# Patient Record
Sex: Male | Born: 1985 | Race: White | Hispanic: No | Marital: Married | Smoking: Never smoker
Health system: Southern US, Community
[De-identification: ages and names within clinical notes are randomized; demographics above are authoritative.]

## PROBLEM LIST (undated history)

## (undated) DIAGNOSIS — J45909 Unspecified asthma, uncomplicated: Secondary | ICD-10-CM

## (undated) DIAGNOSIS — N200 Calculus of kidney: Secondary | ICD-10-CM

## (undated) HISTORY — PX: URETEROSCOPY: SHX842

---

## 2014-01-26 ENCOUNTER — Encounter (HOSPITAL_COMMUNITY): Payer: Self-pay | Admitting: Emergency Medicine

## 2014-01-26 ENCOUNTER — Emergency Department (HOSPITAL_COMMUNITY): Payer: No Typology Code available for payment source

## 2014-01-26 ENCOUNTER — Emergency Department (HOSPITAL_COMMUNITY)
Admission: EM | Admit: 2014-01-26 | Discharge: 2014-01-26 | Disposition: A | Discharge status: Home / Self Care | Payer: No Typology Code available for payment source | Attending: Emergency Medicine | Admitting: Emergency Medicine

## 2014-01-26 DIAGNOSIS — Z87442 Personal history of urinary calculi: Secondary | ICD-10-CM | POA: Diagnosis not present

## 2014-01-26 DIAGNOSIS — Z720 Tobacco use: Secondary | ICD-10-CM | POA: Insufficient documentation

## 2014-01-26 DIAGNOSIS — J45909 Unspecified asthma, uncomplicated: Secondary | ICD-10-CM | POA: Diagnosis not present

## 2014-01-26 DIAGNOSIS — R197 Diarrhea, unspecified: Secondary | ICD-10-CM | POA: Insufficient documentation

## 2014-01-26 DIAGNOSIS — J159 Unspecified bacterial pneumonia: Secondary | ICD-10-CM | POA: Insufficient documentation

## 2014-01-26 DIAGNOSIS — M549 Dorsalgia, unspecified: Secondary | ICD-10-CM | POA: Diagnosis not present

## 2014-01-26 DIAGNOSIS — N3090 Cystitis, unspecified without hematuria: Secondary | ICD-10-CM

## 2014-01-26 DIAGNOSIS — R319 Hematuria, unspecified: Secondary | ICD-10-CM | POA: Diagnosis present

## 2014-01-26 DIAGNOSIS — R310 Gross hematuria: Secondary | ICD-10-CM

## 2014-01-26 DIAGNOSIS — N3091 Cystitis, unspecified with hematuria: Secondary | ICD-10-CM | POA: Insufficient documentation

## 2014-01-26 DIAGNOSIS — R109 Unspecified abdominal pain: Secondary | ICD-10-CM

## 2014-01-26 DIAGNOSIS — J189 Pneumonia, unspecified organism: Secondary | ICD-10-CM

## 2014-01-26 HISTORY — DX: Calculus of kidney: N20.0

## 2014-01-26 HISTORY — DX: Unspecified asthma, uncomplicated: J45.909

## 2014-01-26 LAB — URINE MICROSCOPIC-ADD ON

## 2014-01-26 LAB — COMPREHENSIVE METABOLIC PANEL
ALBUMIN: 4.2 g/dL (ref 3.5–5.2)
ALT: 19 U/L (ref 0–53)
ANION GAP: 6 (ref 5–15)
AST: 24 U/L (ref 0–37)
Alkaline Phosphatase: 56 U/L (ref 39–117)
BILIRUBIN TOTAL: 1.1 mg/dL (ref 0.3–1.2)
BUN: 9 mg/dL (ref 6–23)
CALCIUM: 9 mg/dL (ref 8.4–10.5)
CO2: 26 mmol/L (ref 19–32)
CREATININE: 0.93 mg/dL (ref 0.50–1.35)
Chloride: 106 mEq/L (ref 96–112)
GFR calc non Af Amer: >90 mL/min (ref >90)
Glucose, Bld: 87 mg/dL (ref 70–99)
POTASSIUM: 4 mmol/L (ref 3.5–5.1)
Sodium: 138 mmol/L (ref 135–145)
Total Protein: 6.9 g/dL (ref 6.0–8.3)

## 2014-01-26 LAB — URINALYSIS, ROUTINE W REFLEX MICROSCOPIC
BILIRUBIN URINE: NEGATIVE
Glucose, UA: NEGATIVE mg/dL
Ketones, ur: NEGATIVE mg/dL
NITRITE: NEGATIVE
PH: 6 (ref 5.0–8.0)
Protein, ur: 30 mg/dL — AB
SPECIFIC GRAVITY, URINE: 1.018 (ref 1.005–1.030)
Urobilinogen, UA: 4 mg/dL — ABNORMAL HIGH (ref 0.0–1.0)

## 2014-01-26 LAB — CBC WITH DIFFERENTIAL/PLATELET
BASOS ABS: 0.1 10*3/uL (ref 0.0–0.1)
BASOS PCT: 1 % (ref 0–1)
Eosinophils Absolute: 0 10*3/uL (ref 0.0–0.7)
Eosinophils Relative: 0 % (ref 0–5)
HEMATOCRIT: 43.3 % (ref 39.0–52.0)
Hemoglobin: 15.6 g/dL (ref 13.0–17.0)
LYMPHS PCT: 17 % (ref 12–46)
Lymphs Abs: 0.9 10*3/uL (ref 0.7–4.0)
MCH: 29.4 pg (ref 26.0–34.0)
MCHC: 36 g/dL (ref 30.0–36.0)
MCV: 81.7 fL (ref 78.0–100.0)
MONO ABS: 0.7 10*3/uL (ref 0.1–1.0)
Monocytes Relative: 12 % (ref 3–12)
NEUTROS ABS: 3.7 10*3/uL (ref 1.7–7.7)
NEUTROS PCT: 70 % (ref 43–77)
PLATELETS: 191 10*3/uL (ref 150–400)
RBC: 5.3 MIL/uL (ref 4.22–5.81)
RDW: 12 % (ref 11.5–15.5)
WBC: 5.4 10*3/uL (ref 4.0–10.5)

## 2014-01-26 MED ORDER — AZITHROMYCIN 250 MG PO TABS
500.0000 mg | ORAL_TABLET | Freq: Once | ORAL | Status: AC
  Administered 2014-01-26: 500 mg via ORAL
  Filled 2014-01-26: qty 2

## 2014-01-26 MED ORDER — SULFAMETHOXAZOLE-TRIMETHOPRIM 800-160 MG PO TABS: 1.0000 | ORAL_TABLET | Freq: Two times a day (BID) | ORAL | Status: AC

## 2014-01-26 MED ORDER — AZITHROMYCIN 250 MG PO TABS: 250.0000 mg | ORAL_TABLET | Freq: Every day | ORAL | Status: DC

## 2014-01-26 MED ORDER — SULFAMETHOXAZOLE-TRIMETHOPRIM 800-160 MG PO TABS
1.0000 | ORAL_TABLET | Freq: Once | ORAL | Status: AC
  Administered 2014-01-26: 1 via ORAL
  Filled 2014-01-26: qty 1

## 2014-01-26 NOTE — ED Notes (Signed)
Pt reports intermittent right flank pain since yesterday, states he has multiple kidney stones that he has not passed, denies pain at this time but has noticed blood in his urine this afternoon, no distress noted.

## 2014-01-26 NOTE — ED Provider Notes (Signed)
CSN: [id number]     Arrival date & time 01/26/14  [id number] History  This chart was scribed for non-physician practitioner working with Tilden FossaElizabeth Rees, MD by Elveria Risingimelie Horne, ED Scribe. This patient was seen in room TR07C/TR07C and the patient's care was started at 8:57 PM.   Chief Complaint  Patient presents with  . Hematuria    The history is provided by the patient. No language interpreter was used.   HPI Comments: Steven Walls is a 28 y.o. male with PMHx of kidney stones who presents to the Emergency Department complaining of hematuria and right flank pain, onset yesterday. Patient reports left flank pain, then right flank pain. This morning patient's pain radiated to right lower quadrant. Patient reports that the blood in urine has darkened since onset of his pain. Patient reports intermittent fever at home and diarrhea this morning. Patient shares that he has 9 kidney stones and has passed three. Patient reports that since 2005 he has experienced severe, sharp pain associated with kidney stones twice, but this pain is different. Patient states that his current is less severe nature. Patient is here from [place] and states that he will be driving back tomorrow. Patient has a urologist there with whom he can follow up.   Past Medical History  Diagnosis Date  . Renal stones   . Asthma    History reviewed. No pertinent past surgical history. No family history on file. History  Substance Use Topics  . Smoking status: Current Every Day Smoker  . Smokeless tobacco: Not on file  . Alcohol Use: No    Review of Systems  Constitutional: Positive for fever. Negative for chills.  Gastrointestinal: Positive for diarrhea.  Genitourinary: Positive for hematuria and flank pain. Negative for dysuria.  Musculoskeletal: Positive for back pain.  All other systems reviewed and are negative.  Allergies  Review of patient's allergies indicates no known allergies.  Home Medications   Prior to  Admission medications   Not on File   Triage Vitals: BP 116/69 mmHg  Pulse 86  Temp(Src) 97.5 F (36.4 C) (Oral)  Resp 16  Ht 5\' 5"  (1.651 m)  Wt 130 lb (58.968 kg)  BMI 21.63 kg/m2  SpO2 100% Physical Exam  Constitutional: He is oriented to person, place, and time. He appears well-developed and well-nourished. No distress.  HENT:  Head: Normocephalic and atraumatic.  Eyes: EOM are normal.  Neck: Neck supple. No tracheal deviation present.  Cardiovascular: Normal rate.   Pulmonary/Chest: Effort normal. No respiratory distress.  Abdominal:  Right lower flank pain radiating to RLQ.   Musculoskeletal: Normal range of motion.  Neurological: He is alert and oriented to person, place, and time.  Skin: Skin is warm and dry.  Psychiatric: He has a normal mood and affect. His behavior is normal.  Nursing note and vitals reviewed.   ED Course  Procedures (including critical care time)  COORDINATION OF CARE: 8:57 PM- Discussed treatment plan with patient at bedside and patient agreed to plan.   Labs Review Labs Reviewed  URINALYSIS, ROUTINE W REFLEX MICROSCOPIC - Abnormal; Notable for the following:    Color, Urine AMBER (*)    APPearance CLOUDY (*)    Hgb urine dipstick LARGE (*)    Protein, ur 30 (*)    Urobilinogen, UA 4.0 (*)    Leukocytes, UA TRACE (*)    All other components within normal limits  URINE MICROSCOPIC-ADD ON - Abnormal; Notable for the following:    Bacteria, UA FEW (*)  All other components within normal limits  CBC WITH DIFFERENTIAL  COMPREHENSIVE METABOLIC PANEL    Imaging Review No results found.   EKG Interpretation None     Radiology and labs reviewed, results shared with patient. CT reveals non-obstructive kidney stones bilaterally, likely cystitis, and left lower lobe pneumonia.  Patient is visiting from [place] and will be return home tomorrow. He has a PCP and nephrologist to follow-up with.  Given initial dose of azithromycin and septra  in ED, with prescriptions provided for continuation of treatment. MDM   Final diagnoses:  None    LLL pneumonia. Cystitis.  I personally performed the services described in this documentation, which was scribed in my presence. The recorded information has been reviewed and is accurate.    Jimmye Normanavid John Sheryl Saintil, NP 01/26/14 [id number]  Tilden FossaElizabeth Rees, MD 01/26/14 [phone]

## 2014-01-26 NOTE — Discharge Instructions (Signed)
Urinary Tract Infection °Urinary tract infections (UTIs) can develop anywhere along your urinary tract. Your urinary tract is your body's drainage system for removing wastes and extra water. Your urinary tract includes two kidneys, two ureters, a bladder, and a urethra. Your kidneys are a pair of bean-shaped organs. Each kidney is about the size of your fist. They are located below your ribs, one on each side of your spine. °CAUSES °Infections are caused by microbes, which are microscopic organisms, including fungi, viruses, and bacteria. These organisms are so small that they can only be seen through a microscope. Bacteria are the microbes that most commonly cause UTIs. °SYMPTOMS  °Symptoms of UTIs may vary by age and gender of the patient and by the location of the infection. Symptoms in young women typically include a frequent and intense urge to urinate and a painful, burning feeling in the bladder or urethra during urination. Older women and men are more likely to be tired, shaky, and weak and have muscle aches and abdominal pain. A fever may mean the infection is in your kidneys. Other symptoms of a kidney infection include pain in your back or sides below the ribs, nausea, and vomiting. °DIAGNOSIS °To diagnose a UTI, your caregiver will ask you about your symptoms. Your caregiver also will ask to provide a urine sample. The urine sample will be tested for bacteria and white blood cells. White blood cells are made by your body to help fight infection. °TREATMENT  °Typically, UTIs can be treated with medication. Because most UTIs are caused by a bacterial infection, they usually can be treated with the use of antibiotics. The choice of antibiotic and length of treatment depend on your symptoms and the type of bacteria causing your infection. °HOME CARE INSTRUCTIONS °· If you were prescribed antibiotics, take them exactly as your caregiver instructs you. Finish the medication even if you feel better after you  have only taken some of the medication. °· Drink enough water and fluids to keep your urine clear or pale yellow. °· Avoid caffeine, tea, and carbonated beverages. They tend to irritate your bladder. °· Empty your bladder often. Avoid holding urine for long periods of time. °· Empty your bladder before and after sexual intercourse. °· After a bowel movement, women should cleanse from front to back. Use each tissue only once. °SEEK MEDICAL CARE IF:  °· You have back pain. °· You develop a fever. °· Your symptoms do not begin to resolve within 3 days. °SEEK IMMEDIATE MEDICAL CARE IF:  °· You have severe back pain or lower abdominal pain. °· You develop chills. °· You have nausea or vomiting. °· You have continued burning or discomfort with urination. °MAKE SURE YOU:  °· Understand these instructions. °· Will watch your condition. °· Will get help right away if you are not doing well or get worse. °Document Released: 10/23/2004 Document Revised: 07/15/2011 Document Reviewed: 02/21/2011 °ExitCare® Patient Information ©2015 [employer]. This information is not intended to replace advice given to you by your health care provider. Make sure you discuss any questions you have with your health care provider. ° °Pneumonia °Pneumonia is an infection of the lungs.  °CAUSES °Pneumonia may be caused by bacteria or a virus. Usually, these infections are caused by breathing infectious particles into the lungs (respiratory tract). °SIGNS AND SYMPTOMS  °· Cough. °· Fever. °· Chest pain. °· Increased rate of breathing. °· Wheezing. °· Mucus production. °DIAGNOSIS  °If you have the common symptoms of pneumonia, your health   care provider will typically confirm the diagnosis with a chest X-ray. The X-ray will show an abnormality in the lung (pulmonary infiltrate) if you have pneumonia. Other tests of your blood, urine, or sputum may be done to find the specific cause of your pneumonia. Your health care provider may also do tests (blood  gases or pulse oximetry) to see how well your lungs are working. °TREATMENT  °Some forms of pneumonia may be spread to other people when you cough or sneeze. You may be asked to wear a mask before and during your exam. Pneumonia that is caused by bacteria is treated with antibiotic medicine. Pneumonia that is caused by the influenza virus may be treated with an antiviral medicine. Most other viral infections must run their course. These infections will not respond to antibiotics.  °HOME CARE INSTRUCTIONS  °· Cough suppressants may be used if you are losing too much rest. However, coughing protects you by clearing your lungs. You should avoid using cough suppressants if you can. °· Your health care provider may have prescribed medicine if he or she thinks your pneumonia is caused by bacteria or influenza. Finish your medicine even if you start to feel better. °· Your health care provider may also prescribe an expectorant. This loosens the mucus to be coughed up. °· Take medicines only as directed by your health care provider. °· Do not smoke. Smoking is a common cause of bronchitis and can contribute to pneumonia. If you are a smoker and continue to smoke, your cough may last several weeks after your pneumonia has cleared. °· A cold steam vaporizer or humidifier in your room or home may help loosen mucus. °· Coughing is often worse at night. Sleeping in a semi-upright position in a recliner or using a couple pillows under your head will help with this. °· Get rest as you feel it is needed. Your body will usually let you know when you need to rest. °PREVENTION °A pneumococcal shot (vaccine) is available to prevent a common bacterial cause of pneumonia. This is usually suggested for: °· People over 65 years old. °· Patients on chemotherapy. °· People with chronic lung problems, such as bronchitis or emphysema. °· People with immune system problems. °If you are over 65 or have a high risk condition, you may receive the  pneumococcal vaccine if you have not received it before. In some countries, a routine influenza vaccine is also recommended. This vaccine can help prevent some cases of pneumonia. You may be offered the influenza vaccine as part of your care. °If you smoke, it is time to quit. You may receive instructions on how to stop smoking. Your health care provider can provide medicines and counseling to help you quit. °SEEK MEDICAL CARE IF: °You have a fever. °SEEK IMMEDIATE MEDICAL CARE IF:  °· Your illness becomes worse. This is especially true if you are elderly or weakened from any other disease. °· You cannot control your cough with suppressants and are losing sleep. °· You begin coughing up blood. °· You develop pain which is getting worse or is uncontrolled with medicines. °· Any of the symptoms which initially brought you in for treatment are getting worse rather than better. °· You develop shortness of breath or chest pain. °MAKE SURE YOU:  °· Understand these instructions. °· Will watch your condition. °· Will get help right away if you are not doing well or get worse. °Document Released: 01/13/2005 Document Revised: 05/30/2013 Document Reviewed: 04/04/2010 °ExitCare® Patient Information ©2015 [employer].   This information is not intended to replace advice given to you by your health care provider. Make sure you discuss any questions you have with your health care provider. ° ° °

## 2014-01-26 NOTE — ED Notes (Signed)
Pt. reports hematuria and low back pain onset yesterday , denies fever or dysuria . No fall or injury.

## 2020-03-02 ENCOUNTER — Ambulatory Visit: Payer: No Typology Code available for payment source | Admitting: Family Medicine

## 2020-03-23 ENCOUNTER — Other Ambulatory Visit: Payer: Self-pay | Admitting: Urology

## 2020-03-23 DIAGNOSIS — N201 Calculus of ureter: Secondary | ICD-10-CM

## 2020-03-26 ENCOUNTER — Other Ambulatory Visit (HOSPITAL_COMMUNITY)
Admission: RE | Admit: 2020-03-26 | Discharge: 2020-03-26 | Disposition: A | Discharge status: Home / Self Care | Payer: PRIVATE HEALTH INSURANCE | Source: Ambulatory Visit | Attending: Urology | Admitting: Urology

## 2020-03-26 DIAGNOSIS — Z01812 Encounter for preprocedural laboratory examination: Secondary | ICD-10-CM | POA: Insufficient documentation

## 2020-03-26 DIAGNOSIS — Z20822 Contact with and (suspected) exposure to covid-19: Secondary | ICD-10-CM | POA: Diagnosis not present

## 2020-03-26 LAB — SARS CORONAVIRUS 2 (TAT 6-24 HRS): SARS Coronavirus 2: NEGATIVE

## 2020-03-27 NOTE — Progress Notes (Signed)
Patient to arrive at 0915 on 03/29/20. History and medications reviewed. All pre-procedure instructions given. NPO after MN on Wednesday except for clear liquids until 0715. Driver secured.

## 2020-03-28 NOTE — H&P (Signed)
Patient is a 35 year old middle [place] male who presents for follow-up of kidney stones. He had has history of stones dating back to 2005 when he was living in saw the [place]. No intervention performed at that time. He subsequently moved to the [place] and was living in [place] in 2017 and had acute onset of flank pain. Ultimately required ureteroscopy and laser lithotripsy with placement of a stent at that time. Was told that he had calcium based stones. Has had no interim GU issues but was told he may have some small stones remaining in the kidney. He wanted to establish urologic follow-up in regards to his kidney stones. Currently he is asymptomatic  Micro urinalysis today shows 3-10 RBCs and rare bacteria  KUB is reviewed today and shows: No obvious bony abnormalities. There is a slender call calcification measuring about 8 mm x 4 mm in the region of the right UPJ or renal pelvic area. There is also a smaller 3 mm calcification overlying the left renal shadow. No obvious distal ureteral calculi noted.  -03/23/20-patient with prior history of nephrolithiasis. Was establishing care here and had KUB suggesting possible UPJ stone on the right. Has had CT urogram in the interim on 03/22/2020 which was reviewed today. This shows some small bilateral nonobstructing stones. There is a 9 x5 mm calculus in the mid right ureter the L3-L4 level with some mild hydronephrosis.(report says 21mm) Here to discuss next steps of management. See CT report below. The patient has been relatively asymptomatic with only occasional pain in the right side but does not last for more than 10 seconds at a time by his report. Had no nausea vomiting fever or gross hematuria.    CLINICAL DATA: History of renal calculus, re-evaluate stone burden.  Currently no pain, no fever.   EXAM:  CT ABDOMEN AND PELVIS WITHOUT CONTRAST   TECHNIQUE:  Multidetector CT imaging of the abdomen and pelvis was performed  following the standard  protocol without IV contrast.   COMPARISON: CT abdomen pelvis February 28, 2015 and January 26, 2014.   FINDINGS:  Lower chest: No acute abnormality. Normal size heart. No pericardial  effusion.   Hepatobiliary: Unremarkable noncontrast appearance of the hepatic  parenchyma. Gallbladder is unremarkable. No biliary ductal  dilatation.   Pancreas: Unremarkable   Spleen: Unremarkable   Adrenals/Urinary Tract: Bilateral adrenal glands are unremarkable.   RIGHT sided mild hydroureteronephrosis to the level of a 4 mm stone  in the mid ureter at the L3-L4 vertebral body level. There are 2  other small nonobstructive renal calculi including a 2 mm upper  polar and 3 mm interpolar renal calculus.   LEFT sided 3 mm lower pole and punctate interpolar renal calculi  both of which are new or enlarged since prior. The previously  visualized upper pole renal calculus is no longer present.   Urinary bladder is incompletely distended otherwise unremarkable.   Stomach/Bowel: Stomach is within normal limits. Appendix appears  normal. No evidence of bowel wall thickening, distention, or  inflammatory changes.   Vascular/Lymphatic: No significant vascular findings are present. No  enlarged abdominal or pelvic lymph nodes.   Reproductive: Prostate is unremarkable.   Other: No abdominal wall hernia or abnormality. No abdominopelvic  ascites.   Musculoskeletal: No acute or significant osseous findings.   IMPRESSION:  1. Mild RIGHT-sided hydroureteronephrosis to the level of a 4 mm  stone in the mid ureter at the L3-L4 vertebral body level.  2. Multiple additional small bilateral nonobstructive  renal calculi.   These results will be called to the ordering clinician or  representative by the Radiologist Assistant, and communication  documented in the PACS or [employer].    Electronically Signed  By: Maudry Mayhew MD  On: 03/22/2020 13:15      ALLERGIES: None   MEDICATIONS:  Meloxicam 7.5 mg tablet     GU PSH: Ureteroscopic stone removal     NON-GU PSH: No Non-GU PSH    GU PMH: History of urolithiasis - 03/21/2020, - 02/16/2020    NON-GU PMH: Asthma    FAMILY HISTORY: kidney stone - Brother   SOCIAL HISTORY: Marital Status: Married Ethnicity: Not Hispanic Or Latino Current Smoking Status: Patient does not smoke anymore.   Tobacco Use Assessment Completed: Used Tobacco in last 30 days? Has never drank.  Drinks 1 caffeinated drink per day.    REVIEW OF SYSTEMS:    GU Review Male:   Patient denies frequent urination, hard to postpone urination, burning/ pain with urination, get up at night to urinate, leakage of urine, stream starts and stops, trouble starting your stream, have to strain to urinate , erection problems, and penile pain.  Gastrointestinal (Upper):   Patient denies nausea, vomiting, and indigestion/ heartburn.  Gastrointestinal (Lower):   Patient denies diarrhea and constipation.  Constitutional:   Patient denies fever, night sweats, weight loss, and fatigue.  Skin:   Patient denies skin rash/ lesion and itching.  Eyes:   Patient denies blurred vision and double vision.  Ears/ Nose/ Throat:   Patient denies sore throat and sinus problems.  Hematologic/Lymphatic:   Patient denies swollen glands and easy bruising.  Cardiovascular:   Patient denies leg swelling and chest pains.  Respiratory:   Patient denies cough and shortness of breath.  Endocrine:   Patient denies excessive thirst.  Musculoskeletal:   Patient denies back pain and joint pain.  Neurological:   Patient denies headaches and dizziness.  Psychologic:   Patient denies depression and anxiety.   VITAL SIGNS:      03/23/2020 08:27 AM  Weight 140 lb / 63.5 kg  Height 65 in / 165.1 cm  BP 114/70 mmHg  Pulse 61 /min  Temperature 98.2 F / 36.7 C  BMI 23.3 kg/m   MULTI-SYSTEM PHYSICAL EXAMINATION:    Constitutional: Well-nourished. No physical deformities. Normally  developed. Good grooming.  Neck: Neck symmetrical, not swollen. Normal tracheal position.  Respiratory: No labored breathing, no use of accessory muscles.   Cardiovascular: Normal temperature, normal extremity pulses, no swelling, no varicosities.  Lymphatic: No enlargement of neck, axillae, groin.  Skin: No paleness, no jaundice, no cyanosis. No lesion, no ulcer, no rash.  Neurologic / Psychiatric: Oriented to time, oriented to place, oriented to person. No depression, no anxiety, no agitation.  Eyes: Normal conjunctivae. Normal eyelids.  Ears, Nose, Mouth, and Throat: Left ear no scars, no lesions, no masses. Right ear no scars, no lesions, no masses. Nose no scars, no lesions, no masses. Normal hearing. Normal lips.  Musculoskeletal: Normal gait and station of head and neck.     Complexity of Data:  Source Of History:  Patient  Records Review:   Previous Doctor Records  Urine Test Review:   Urinalysis  X-Ray Review: C.T. Abdomen/Pelvis: Reviewed Films. Reviewed Report. Discussed With Patient.     PROCEDURES:          Urinalysis w/Scope Dipstick Dipstick Cont'd Micro  Color: Yellow Bilirubin: Neg mg/dL WBC/hpf: 0 - 5/hpf  Appearance: Clear Ketones: Neg mg/dL  RBC/hpf: 20 - 40/hpf  Specific Gravity: 1.025 Blood: 1+ ery/uL Bacteria: Rare (0-9/hpf)  pH: 6.0 Protein: Neg mg/dL Cystals: NS (Not Seen)  Glucose: Neg mg/dL Urobilinogen: 0.2 mg/dL Casts: NS (Not Seen)    Nitrites: Neg Trichomonas: Not Present    Leukocyte Esterase: Neg leu/uL Mucous: Present      Epithelial Cells: 0 - 5/hpf      Yeast: NS (Not Seen)      Sperm: Not Present    ASSESSMENT:      ICD-10 Details  1 GU:   Ureteral calculus - N20.1 Acute, Complicated Injury   PLAN:           Document Letter(s):  Created for Patient: Clinical Summary         Notes:   I discussed CT results in in detail showing a 9 x 6 mm right mid ureteral calculus. We discussed treatment options including ureteroscopy with laser  lithotripsy versus in situ ESL. I told him it would be unusual to pass the stone this size therefore recommended treatment. After discussion of options of management he has elected for in situ ESL. This will be scheduled accordingly in the near future. Risks and benefits discussed in detail as below.  I have discussed with the patient the risks and consequences of the procedure of extracorporeal shockwave lithotripsy to include, but not limited to: Bleeding, including bleeding in the urine, bleeding around the kidney with hematoma formation and rarely bleeding to the point of loss of the kidney, infection, damage to the surrounding structures including soft tissue and bowel perforation, residual stone fragments requiring the need for future treatments including endoscopic surgery, open surgery or percutaneous surgery. I have emphasized that study showed that up to 25% of patients will require additional procedures depending on stone size and composition. I have also discussed with the patient that the success rate for ESWL is approximately 60-90% and depends on stone location and stone composition as well as preoperative stone size. The patient voices understanding of the risks and benefits of the above and consents to the procedure.

## 2020-03-29 ENCOUNTER — Ambulatory Visit (HOSPITAL_COMMUNITY): Payer: PRIVATE HEALTH INSURANCE

## 2020-03-29 ENCOUNTER — Other Ambulatory Visit: Payer: Self-pay

## 2020-03-29 ENCOUNTER — Encounter (HOSPITAL_BASED_OUTPATIENT_CLINIC_OR_DEPARTMENT_OTHER)
Admission: RE | Disposition: A | Discharge status: Home / Self Care | Payer: Self-pay | Source: Home / Self Care | Attending: Urology

## 2020-03-29 ENCOUNTER — Telehealth: Payer: Self-pay | Admitting: Urology

## 2020-03-29 ENCOUNTER — Ambulatory Visit (HOSPITAL_BASED_OUTPATIENT_CLINIC_OR_DEPARTMENT_OTHER)
Admission: RE | Admit: 2020-03-29 | Discharge: 2020-03-29 | Disposition: A | Discharge status: Home / Self Care | Payer: PRIVATE HEALTH INSURANCE | Attending: Urology | Admitting: Urology

## 2020-03-29 ENCOUNTER — Encounter (HOSPITAL_BASED_OUTPATIENT_CLINIC_OR_DEPARTMENT_OTHER): Payer: Self-pay | Admitting: Urology

## 2020-03-29 DIAGNOSIS — N201 Calculus of ureter: Secondary | ICD-10-CM | POA: Diagnosis present

## 2020-03-29 HISTORY — PX: EXTRACORPOREAL SHOCK WAVE LITHOTRIPSY: SHX1557

## 2020-03-29 SURGERY — LITHOTRIPSY, ESWL
Anesthesia: LOCAL | Laterality: Right

## 2020-03-29 MED ORDER — CIPROFLOXACIN HCL 500 MG PO TABS
500.0000 mg | ORAL_TABLET | ORAL | Status: AC
  Administered 2020-03-29: 500 mg via ORAL

## 2020-03-29 MED ORDER — OXYCODONE-ACETAMINOPHEN 5-325 MG PO TABS
ORAL_TABLET | ORAL | Status: AC
  Filled 2020-03-29: qty 1

## 2020-03-29 MED ORDER — OXYCODONE-ACETAMINOPHEN 5-325 MG PO TABS
1.0000 | ORAL_TABLET | Freq: Once | ORAL | Status: AC
  Administered 2020-03-29: 1 via ORAL

## 2020-03-29 MED ORDER — DIAZEPAM 5 MG PO TABS
ORAL_TABLET | ORAL | Status: AC
  Filled 2020-03-29: qty 2

## 2020-03-29 MED ORDER — DIAZEPAM 5 MG PO TABS
10.0000 mg | ORAL_TABLET | ORAL | Status: AC
  Administered 2020-03-29: 10 mg via ORAL

## 2020-03-29 MED ORDER — DIPHENHYDRAMINE HCL 25 MG PO CAPS
ORAL_CAPSULE | ORAL | Status: AC
  Filled 2020-03-29: qty 1

## 2020-03-29 MED ORDER — OXYCODONE-ACETAMINOPHEN 5-325 MG PO TABS: 1.0000 | ORAL_TABLET | ORAL | 0 refills | Status: AC | PRN

## 2020-03-29 MED ORDER — TAMSULOSIN HCL 0.4 MG PO CAPS: 0.4000 mg | ORAL_CAPSULE | Freq: Every day | ORAL | 0 refills | Status: DC

## 2020-03-29 MED ORDER — DIPHENHYDRAMINE HCL 25 MG PO CAPS
25.0000 mg | ORAL_CAPSULE | ORAL | Status: AC
  Administered 2020-03-29: 25 mg via ORAL

## 2020-03-29 MED ORDER — CIPROFLOXACIN HCL 500 MG PO TABS
ORAL_TABLET | ORAL | Status: AC
  Filled 2020-03-29: qty 1

## 2020-03-29 MED ORDER — SODIUM CHLORIDE 0.9 % IV SOLN: INTRAVENOUS | Status: DC

## 2020-03-29 NOTE — Telephone Encounter (Signed)
Returned call from patient's wife. She reports that the patient is having a lot of pain after his ESWL procedure today. I discussed that he should use heating pads, tylenol (do not exceed more than 4,000mg  or 4g within a 24 hours period), and percocet 1-2 tablets for pain (keeping in mind that this also contains tylenol). If his pain is uncontrolled with these measures, then I recommended calling back and/or going to the ED.

## 2020-03-29 NOTE — Brief Op Note (Signed)
03/29/2020  11:49 AM  PATIENT:  Steven Walls  35 y.o. male  PRE-OPERATIVE DIAGNOSIS:  RIGHT URETERAL CALCULUS  POST-OPERATIVE DIAGNOSIS:  * Same *  PROCEDURE:  Procedure(s): EXTRACORPOREAL SHOCK WAVE LITHOTRIPSY (ESWL) (Right)  SURGEON:  Surgeon(s) and Role:    * Belva Agee, MD - Primary  PHYSICIAN ASSISTANT:   ASSISTANTS: none   ANESTHESIA:   IV sedation  EBL:  minimal   BLOOD ADMINISTERED:none  DRAINS: none   LOCAL MEDICATIONS USED:  NONE  SPECIMEN:  No Specimen  DISPOSITION OF SPECIMEN:  N/A  COUNTS:  YES  TOURNIQUET:  * No tourniquets in log *  DICTATION: .Note written in EPIC  PLAN OF CARE: Discharge to home after PACU  PATIENT DISPOSITION:  PACU - hemodynamically stable.   Delay start of Pharmacological VTE agent (>24hrs) due to surgical blood loss or risk of bleeding: not applicable

## 2020-03-30 ENCOUNTER — Encounter (HOSPITAL_BASED_OUTPATIENT_CLINIC_OR_DEPARTMENT_OTHER): Payer: Self-pay | Admitting: Urology

## 2020-04-09 NOTE — Brief Op Note (Signed)
03/29/2020  5:10 PM  PATIENT:  Salah Chock  35 y.o. male  PRE-OPERATIVE DIAGNOSIS:  RIGHT URETERAL CALCULUS  POST-OPERATIVE DIAGNOSIS: SAME  PROCEDURE:  Procedure(s): EXTRACORPOREAL SHOCK WAVE LITHOTRIPSY (ESWL) (Right)  SURGEON:  Surgeon(s) and Role:    * Belva Agee, MD - Primary  PHYSICIAN ASSISTANT:   ASSISTANTS: none   ANESTHESIA:   Iv sedation  EBL:  minimal   BLOOD ADMINISTERED:none  DRAINS: none   LOCAL MEDICATIONS USED:  NONE  SPECIMEN:  No Specimen  DISPOSITION OF SPECIMEN:  N/A  COUNTS:  YES  TOURNIQUET:  * No tourniquets in log *  DICTATION: .Note written in EPIC  PLAN OF CARE: Discharge to home after PACU  PATIENT DISPOSITION:  PACU - hemodynamically stable.   Delay start of Pharmacological VTE agent (>24hrs) due to surgical blood loss or risk of bleeding: not applicable

## 2020-04-09 NOTE — Op Note (Signed)
03/29/2020  5:12 PM  PATIENT:  Steven Walls  35 y.o. male  PRE-OPERATIVE DIAGNOSIS:  RIGHT URETERAL CALCULUS  POST-OPERATIVE DIAGNOSIS:  * Same *  PROCEDURE:  Procedure(s): EXTRACORPOREAL SHOCK WAVE LITHOTRIPSY (ESWL) (Right)  SURGEON:  Surgeon(s) and Role:    * Belva Agee, MD - Primary  PHYSICIAN ASSISTANT: none  ASSISTANTS: none   ANESTHESIA:   IV sedation  EBL:  minimal   BLOOD ADMINISTERED:none  DRAINS: none   LOCAL MEDICATIONS USED:  NONE  SPECIMEN:  No Specimen  DISPOSITION OF SPECIMEN:  N/A  COUNTS:  YES  TOURNIQUET:  * No tourniquets in log *  DICTATION: .Note written in EPIC  PLAN OF CARE: Discharge to home after PACU  PATIENT DISPOSITION:  PACU - hemodynamically stable.   Delay start of Pharmacological VTE agent (>24hrs) due to surgical blood loss or risk of bleeding: not applicable

## 2022-05-01 ENCOUNTER — Ambulatory Visit
Admission: RE | Admit: 2022-05-01 | Discharge: 2022-05-01 | Disposition: A | Discharge status: Home / Self Care | Payer: Commercial Managed Care - PPO | Source: Ambulatory Visit | Attending: Urgent Care | Admitting: Urgent Care

## 2022-05-01 ENCOUNTER — Ambulatory Visit (INDEPENDENT_AMBULATORY_CARE_PROVIDER_SITE_OTHER): Payer: Commercial Managed Care - PPO

## 2022-05-01 VITALS — BP 127/67 | HR 63 | Temp 98.2°F | Resp 16

## 2022-05-01 DIAGNOSIS — N23 Unspecified renal colic: Secondary | ICD-10-CM

## 2022-05-01 DIAGNOSIS — R109 Unspecified abdominal pain: Secondary | ICD-10-CM | POA: Diagnosis present

## 2022-05-01 DIAGNOSIS — R310 Gross hematuria: Secondary | ICD-10-CM | POA: Insufficient documentation

## 2022-05-01 LAB — POCT URINALYSIS DIP (MANUAL ENTRY)
Glucose, UA: NEGATIVE mg/dL
Ketones, POC UA: NEGATIVE mg/dL
Nitrite, UA: NEGATIVE
Protein Ur, POC: 100 mg/dL — AB
Spec Grav, UA: 1.02 (ref 1.010–1.025)
Urobilinogen, UA: 0.2 E.U./dL
pH, UA: 7 (ref 5.0–8.0)

## 2022-05-01 MED ORDER — TAMSULOSIN HCL 0.4 MG PO CAPS: 0.4000 mg | ORAL_CAPSULE | Freq: Every day | ORAL | 0 refills | Status: DC

## 2022-05-01 MED ORDER — HYDROCODONE-ACETAMINOPHEN 5-325 MG PO TABS: 1.0000 | ORAL_TABLET | Freq: Four times a day (QID) | ORAL | 0 refills | Status: DC | PRN

## 2022-05-01 MED ORDER — NAPROXEN 500 MG PO TABS: 500.0000 mg | ORAL_TABLET | Freq: Two times a day (BID) | ORAL | 0 refills | Status: DC

## 2022-05-01 NOTE — ED Triage Notes (Signed)
Pt reports blood in urine and right side low back pain since this morning.

## 2022-05-01 NOTE — ED Provider Notes (Signed)
[facility]  Note:  This document was prepared using Systems analyst and may include unintentional dictation errors.  MRN: [id number] DOB: Aug 11, 1985  Subjective:   Steven Walls is a 37 y.o. male presenting for 1 day history of acute onset recurrent hematuria, right-sided low back/flank pain.  Symptoms were moderate to severe this morning and have lessened throughout the day.  Has a longstanding history of renal stones.  Has previously had shockwave lithotripsy, ureteroscopy.  He has set up an appointment with a neurologist but is requesting a referral as they are requiring this to complete his appointment.  Patient is not currently taking any medications.  No current facility-administered medications for this encounter.  Current Outpatient Medications:    ALPRAZolam (XANAX) 1 MG tablet, Take 1 mg by mouth at bedtime as needed for anxiety., Disp: , Rfl:    diphenhydrAMINE (BENADRYL) 25 mg capsule, Take 25 mg by mouth every 6 (six) hours as needed., Disp: , Rfl:    tamsulosin (FLOMAX) 0.4 MG CAPS capsule, Take 1 capsule (0.4 mg total) by mouth daily., Disp: 30 capsule, Rfl: 0   Allergies  Allergen Reactions   Beta Adrenergic Blockers Other (See Comments)    Other reaction(s): None  Contraindicated with allergy immunotherapy    Past Medical History:  Diagnosis Date   Asthma    Renal stones      Past Surgical History:  Procedure Laterality Date   EXTRACORPOREAL SHOCK WAVE LITHOTRIPSY Right 03/29/2020   Procedure: EXTRACORPOREAL SHOCK WAVE LITHOTRIPSY (ESWL);  Surgeon: Remi Haggard, MD;  Location: [facility];  Service: Urology;  Laterality: Right;   URETEROSCOPY      History reviewed. No pertinent family history.  Social History   Tobacco Use   Smoking status: Every Day   Smokeless tobacco: Never  Vaping Use   Vaping Use: Never used  Substance Use Topics   Alcohol use: No   Drug use: No     ROS   Objective:   Vitals: BP 127/67 (BP Location: Right Arm)   Pulse 63   Temp 98.2 F (36.8 C) (Oral)   Resp 16   SpO2 96%   Physical Exam Constitutional:      General: He is not in acute distress.    Appearance: Normal appearance. He is well-developed and normal weight. He is not ill-appearing, toxic-appearing or diaphoretic.  HENT:     Head: Normocephalic and atraumatic.     Right Ear: External ear normal.     Left Ear: External ear normal.     Nose: Nose normal.     Mouth/Throat:     Pharynx: Oropharynx is clear.  Eyes:     General: No scleral icterus.       Right eye: No discharge.        Left eye: No discharge.     Extraocular Movements: Extraocular movements intact.  Cardiovascular:     Rate and Rhythm: Normal rate.  Pulmonary:     Effort: Pulmonary effort is normal.  Abdominal:     General: Bowel sounds are normal. There is no distension.     Palpations: Abdomen is soft. There is no mass.     Tenderness: There is no abdominal tenderness. There is no right CVA tenderness, left CVA tenderness, guarding or rebound.  Musculoskeletal:     Cervical back: Normal range of motion.  Neurological:     Mental Status: He is alert and oriented to person, place, and time.  Psychiatric:        Mood and Affect: Mood normal.        Behavior: Behavior normal.        Thought Content: Thought content normal.        Judgment: Judgment normal.     Results for orders placed or performed during the hospital encounter of 05/01/22 (from the past 24 hour(s))  POCT urinalysis dipstick     Status: Abnormal   Collection Time: 05/01/22  4:11 PM  Result Value Ref Range   Color, UA red (A) yellow   Clarity, UA cloudy (A) clear   Glucose, UA negative negative mg/dL   Bilirubin, UA small (A) negative   Ketones, POC UA negative negative mg/dL   Spec Grav, UA 1.020 1.010 - 1.025   Blood, UA large (A) negative   pH, UA 7.0 5.0 - 8.0   Protein Ur, POC =100 (A) negative mg/dL    Urobilinogen, UA 0.2 0.2 or 1.0 E.U./dL   Nitrite, UA Negative Negative   Leukocytes, UA Trace (A) Negative   DG Abd 1 View  Result Date: 05/01/2022 CLINICAL DATA:  renal colic, history of kidney stones EXAM: ABDOMEN - 1 VIEW COMPARISON:  03/29/2020 FINDINGS: Possible 6 mm calcification along the expected course of the right ureter overlying the sacrum. 2 mm calcification projects over the wall lower pole left renal collecting system. Moderate fecal material overlies both renal collecting systems. Normal bowel gas pattern. Regional bones unremarkable. IMPRESSION: Possible 6 mm mid right ureteral calculus. Electronically Signed   By: Lucrezia Europe M.D.   On: 05/01/2022 16:45    Assessment and Plan :   PDMP not reviewed this encounter.  1. Renal colic on left side   2. Gross hematuria   3. Right flank pain     Recommended management of renal colic with aggressive hydration, naproxen for pain and inflammation, hydrocodone for breakthrough pain.  Use Flomax to promote urine flow.  I provided patient with an external referral as the system will not allow me to directly refer to [facility] in [place].  Maintain strict ER precautions.   Jaynee Eagles, [place] 05/01/22 1701

## 2022-05-01 NOTE — Discharge Instructions (Addendum)
Please let me know if your urology practice requires more to complete your referral.  Go ahead and start tamsulosin to help promote urine flow and help you pass the kidney stone.  Hydrate very well with 80-100 ounces of water daily.  I am ordering an urine culture just to make sure that you do not have a secondary kidney stone infection.  Will let you know about these results on Monday when they lab reports them to [place].  You can use naproxen for pain and inflammation, kidney stone pain.  If this is not helping you, you can use hydrocodone for severe pain.

## 2022-05-02 LAB — URINE CULTURE: Culture: <10000 — AB

## 2022-07-13 IMAGING — DX DG ABDOMEN 1V
2 series · 2 of 2 positions shown · non-contrast
Comparison: Plain radiograph 02/16/2020, CT 03/21/2020

CLINICAL DATA: Urolithiasis

EXAM:
ABDOMEN - 1 VIEW

[abdomen kub (1 of 2)]
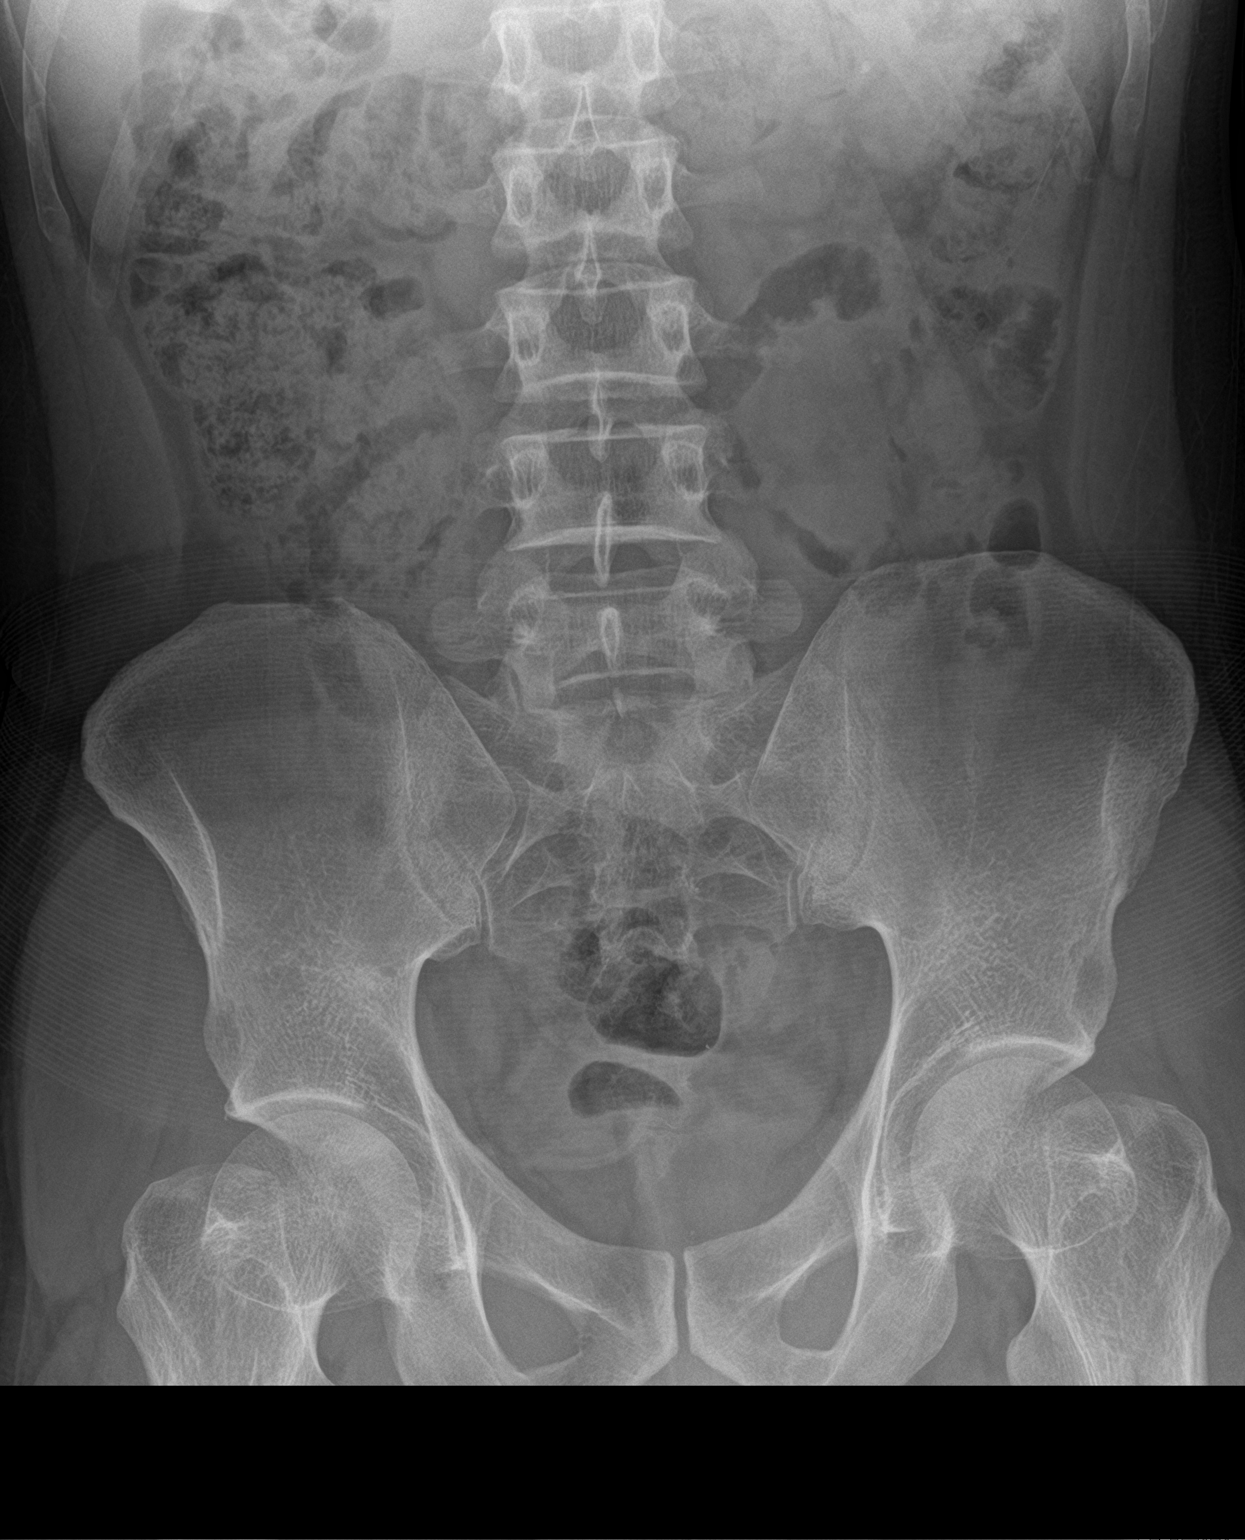

[abdomen kub (2 of 2)]
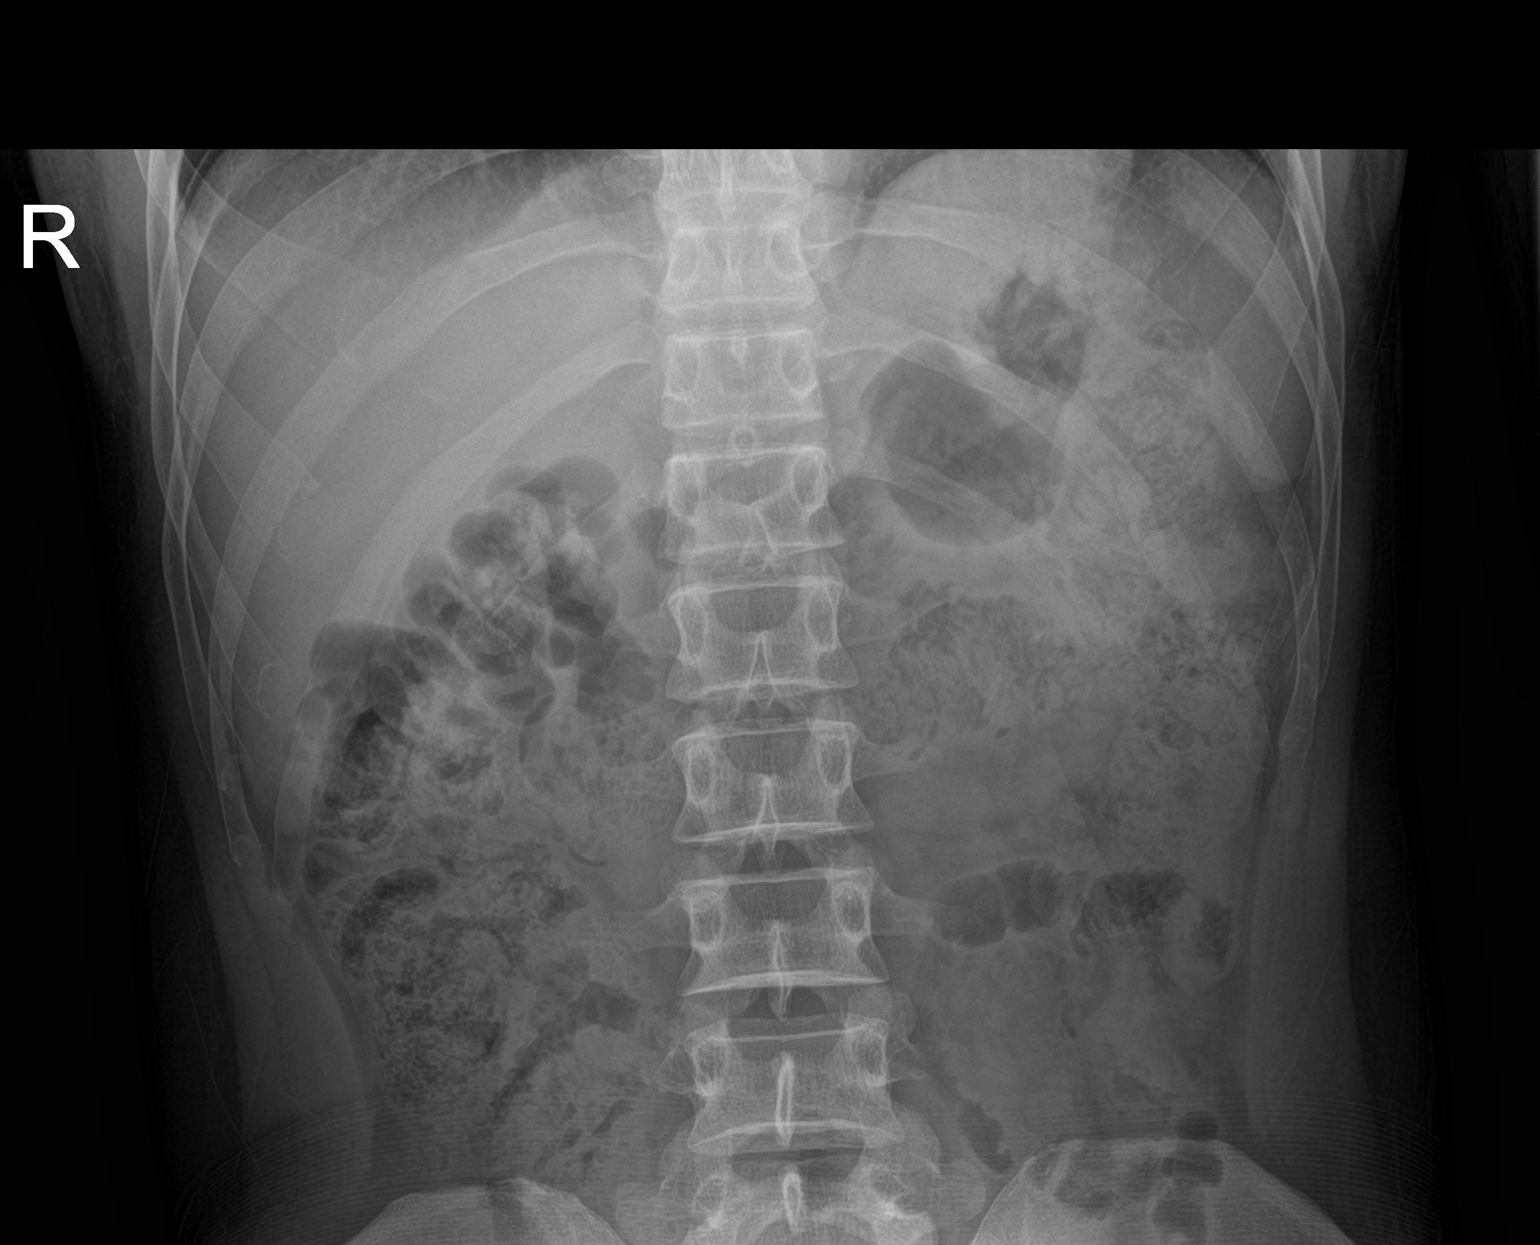

[2 of 2 positions shown; findings below may reference images not displayed]

FINDINGS: Ovoid calculus previously noted within the right paraspinal region
has resolved. No urolithiasis identified. A 3 mm calculus overlies
the lower pole of the left kidney. Extensive stool limits evaluation
of the renal silhouettes, however. Normal abdominal gas pattern. No
acute bone abnormality.
IMPRESSION: Interval resolution of right urolithiasis. Minimal left
nephrolithiasis.

## 2022-09-10 ENCOUNTER — Other Ambulatory Visit: Payer: Self-pay

## 2022-09-10 ENCOUNTER — Ambulatory Visit
Admission: RE | Admit: 2022-09-10 | Discharge: 2022-09-10 | Disposition: A | Discharge status: Home / Self Care | Payer: Commercial Managed Care - PPO | Source: Ambulatory Visit | Attending: Internal Medicine | Admitting: Internal Medicine

## 2022-09-10 ENCOUNTER — Emergency Department (HOSPITAL_BASED_OUTPATIENT_CLINIC_OR_DEPARTMENT_OTHER)
Admission: EM | Admit: 2022-09-10 | Discharge: 2022-09-10 | Disposition: A | Discharge status: Home / Self Care | Payer: Commercial Managed Care - PPO | Attending: Emergency Medicine | Admitting: Emergency Medicine

## 2022-09-10 ENCOUNTER — Encounter (HOSPITAL_BASED_OUTPATIENT_CLINIC_OR_DEPARTMENT_OTHER): Payer: Self-pay | Admitting: Pediatrics

## 2022-09-10 ENCOUNTER — Ambulatory Visit (INDEPENDENT_AMBULATORY_CARE_PROVIDER_SITE_OTHER): Payer: Commercial Managed Care - PPO

## 2022-09-10 ENCOUNTER — Emergency Department (HOSPITAL_BASED_OUTPATIENT_CLINIC_OR_DEPARTMENT_OTHER): Payer: Commercial Managed Care - PPO

## 2022-09-10 VITALS — BP 125/77 | HR 65 | Temp 98.0°F | Resp 18

## 2022-09-10 DIAGNOSIS — R079 Chest pain, unspecified: Secondary | ICD-10-CM | POA: Diagnosis not present

## 2022-09-10 DIAGNOSIS — R42 Dizziness and giddiness: Secondary | ICD-10-CM | POA: Diagnosis not present

## 2022-09-10 DIAGNOSIS — J45909 Unspecified asthma, uncomplicated: Secondary | ICD-10-CM | POA: Insufficient documentation

## 2022-09-10 DIAGNOSIS — R0789 Other chest pain: Secondary | ICD-10-CM | POA: Diagnosis present

## 2022-09-10 DIAGNOSIS — K219 Gastro-esophageal reflux disease without esophagitis: Secondary | ICD-10-CM | POA: Insufficient documentation

## 2022-09-10 LAB — CBC
HCT: 44.7 % (ref 39.0–52.0)
Hemoglobin: 15.5 g/dL (ref 13.0–17.0)
MCH: 27.7 pg (ref 26.0–34.0)
MCHC: 34.7 g/dL (ref 30.0–36.0)
MCV: 80 fL (ref 80.0–100.0)
Platelets: 293 10*3/uL (ref 150–400)
RBC: 5.59 MIL/uL (ref 4.22–5.81)
RDW: 11.9 % (ref 11.5–15.5)
WBC: 3.9 10*3/uL — ABNORMAL LOW (ref 4.0–10.5)
nRBC: 0 % (ref 0.0–0.2)

## 2022-09-10 LAB — BASIC METABOLIC PANEL
Anion gap: 10 (ref 5–15)
BUN: 11 mg/dL (ref 6–20)
CO2: 23 mmol/L (ref 22–32)
Calcium: 9.2 mg/dL (ref 8.9–10.3)
Chloride: 105 mmol/L (ref 98–111)
Creatinine, Ser: 0.91 mg/dL (ref 0.61–1.24)
GFR, Estimated: >60 mL/min (ref >60)
Glucose, Bld: 94 mg/dL (ref 70–99)
Potassium: 3.6 mmol/L (ref 3.5–5.1)
Sodium: 138 mmol/L (ref 135–145)

## 2022-09-10 LAB — TROPONIN I (HIGH SENSITIVITY): Troponin I (High Sensitivity): <2 ng/L (ref <18)

## 2022-09-10 LAB — POCT FASTING CBG KUC MANUAL ENTRY: POCT Glucose (KUC): 129 mg/dL — AB (ref 70–99)

## 2022-09-10 MED ORDER — LIDOCAINE VISCOUS HCL 2 % MT SOLN
15.0000 mL | Freq: Once | OROMUCOSAL | Status: AC
  Administered 2022-09-10: 15 mL via OROMUCOSAL

## 2022-09-10 MED ORDER — LIDOCAINE VISCOUS HCL 2 % MT SOLN: 15.0000 mL | OROMUCOSAL | 0 refills | Status: DC | PRN

## 2022-09-10 MED ORDER — ALUM & MAG HYDROXIDE-SIMETH 200-200-20 MG/5ML PO SUSP
30.0000 mL | Freq: Once | ORAL | Status: AC
  Administered 2022-09-10: 30 mL via ORAL

## 2022-09-10 NOTE — Discharge Instructions (Signed)
Please go to the emergency[facility] for further workup of your symptoms

## 2022-09-10 NOTE — ED Triage Notes (Signed)
C/O chest pain x 1 week ago; treated it as heartburn with no relief.

## 2022-09-10 NOTE — Discharge Instructions (Addendum)
You can combine viscous lidocaine with Maalox, Mylanta, I recommend you take pepcid once daily 30 mins prior to meals, can increased to twice daily if not having relief. If you continue to have symptoms I recommend you follow up with the GI doctor whose contact information I provided.

## 2022-09-10 NOTE — ED Provider Notes (Signed)
[facility] Provider Note   CSN: [id number] Arrival date & time: 09/10/22  1153     History  Chief Complaint  Patient presents with   Chest Pain    Steven Walls is a 37 y.o. male with past medical history significant for asthma, kidney stones presents concern for intermittent chest pain for 1 to 2 weeks.  Patient reports that his burning, nonexertional in nature.  He denies any shortness of breath.  He reports increased caffeine intake and increase stress at work.  Patient denies any history of high blood pressure, high cholesterol, diabetes, denies any current tobacco or alcohol use.  He reports some relief with over-the-counter Tums, Pepcid, GI cocktail earlier this morning.  He was sent from urgent care due to diffuse ST elevation on EKG which was interpreted as possible pericarditis by that provider.  He denies any recent cough, fever, chills, or other viral symptoms   Chest Pain      Home Medications Prior to Admission medications   Medication Sig Start Date End Date Taking? Authorizing Provider  ALPRAZolam Prudy Feeler) 1 MG tablet Take 1 mg by mouth at bedtime as needed for anxiety.    [provider]  diphenhydrAMINE (BENADRYL) 25 mg capsule Take 25 mg by mouth every 6 (six) hours as needed.    [provider]  HYDROcodone-acetaminophen (NORCO/VICODIN) 5-325 MG tablet Take 1 tablet by mouth every 6 (six) hours as needed for severe pain. 05/01/22   Wallis Bamberg, PA-C  lidocaine (XYLOCAINE) 2 % solution Use as directed 15 mLs in the mouth or throat as needed for mouth pain. 09/10/22  Yes Eulises Kijowski H, PA-C  naproxen (NAPROSYN) 500 MG tablet Take 1 tablet (500 mg total) by mouth 2 (two) times daily with a meal. 05/01/22   Wallis Bamberg, PA-C  tamsulosin (FLOMAX) 0.4 MG CAPS capsule Take 1 capsule (0.4 mg total) by mouth daily. 05/01/22   Wallis Bamberg, PA-C      Allergies    Beta adrenergic blockers    Review of  Systems   Review of Systems  Cardiovascular:  Positive for chest pain.  All other systems reviewed and are negative.   Physical Exam Updated Vital Signs BP (!) 144/97   Pulse 62   Temp 98.1 F (36.7 C) (Oral)   Resp 11   Ht 5\' 5"  (1.651 m)   Wt 63.5 kg   SpO2 100%   BMI 23.30 kg/m  Physical Exam Vitals and nursing note reviewed.  Constitutional:      General: He is not in acute distress.    Appearance: Normal appearance.  HENT:     Head: Normocephalic and atraumatic.  Eyes:     General:        Right eye: No discharge.        Left eye: No discharge.  Cardiovascular:     Rate and Rhythm: Normal rate and regular rhythm.     Heart sounds: No murmur heard.    No friction rub. No gallop.  Pulmonary:     Effort: Pulmonary effort is normal.     Breath sounds: Normal breath sounds.  Chest:     Comments: No significant tenderness to palpation of the chest wall Abdominal:     General: Bowel sounds are normal.     Palpations: Abdomen is soft.     Comments: No significant epigastric tenderness to palpation  Skin:    General: Skin is warm and dry.  Capillary Refill: Capillary refill takes less than 2 seconds.  Neurological:     Mental Status: He is alert and oriented to person, place, and time.  Psychiatric:        Mood and Affect: Mood normal.        Behavior: Behavior normal.     ED Results / Procedures / Treatments   Labs (all labs ordered are listed, but only abnormal results are displayed) Labs Reviewed  CBC - Abnormal; Notable for the following components:      Result Value   WBC 3.9 (*)    All other components within normal limits  BASIC METABOLIC PANEL  TROPONIN I (HIGH SENSITIVITY)  TROPONIN I (HIGH SENSITIVITY)    EKG EKG Interpretation Date/Time:  Wednesday September 10 2022 11:59:12 EDT Ventricular Rate:  61 PR Interval:  130 QRS Duration:  89 QT Interval:  398 QTC Calculation: 401 R Axis:   97  Text Interpretation: Sinus rhythm Left atrial  enlargement Borderline right axis deviation ST elev, probable normal early repol pattern No significant change since last tracing Confirmed by Jacalyn Lefevre [phone]) on 09/10/2022 2:01:49 PM  Radiology DG Chest 2 View  Result Date: 09/10/2022 CLINICAL DATA:  Chest pain EXAM: CHEST - 2 VIEW COMPARISON:  09/10/2022 FINDINGS: The heart size and mediastinal contours are within normal limits. Both lungs are clear. The visualized skeletal structures are unremarkable. IMPRESSION: No active cardiopulmonary disease. Electronically Signed   By: Duanne Guess D.O.   On: 09/10/2022 12:36   DG Chest 2 View  Result Date: 09/10/2022 CLINICAL DATA:  Chest pain EXAM: CHEST - 2 VIEW COMPARISON:  None Available. FINDINGS: The heart size and mediastinal contours are within normal limits. Both lungs are clear. The visualized skeletal structures are unremarkable. IMPRESSION: No active cardiopulmonary disease. Electronically Signed   By: Duanne Guess D.O.   On: 09/10/2022 12:35    Procedures Procedures    Medications Ordered in ED Medications - No data to display  ED Course/ Medical Decision Making/ A&P                                 Medical Decision Making Amount and/or Complexity of Data Reviewed Labs: ordered. Radiology: ordered.   This patient is a 37 y.o. male  who presents to the ED for concern of chest pain.   Differential diagnoses prior to evaluation: The emergent differential diagnosis includes, but is not limited to,  ACS, AAS, PE, Mallory-Weiss, Boerhaave's, Pneumonia, acute bronchitis, asthma or COPD exacerbation, anxiety, MSK pain or traumatic injury to the chest, acid reflux versus other . This is not an exhaustive differential.   Past Medical History / Co-morbidities / Social History: Anxiety, kidney stones  Additional history: Chart reviewed. Pertinent results include: Reviewed lab work, imaging from recent urgent care visit, and interpretation of EKG performed at that visit on  my evaluation does not seem to suggest acute pericarditis, more suspicious of diffuse ST elevation secondary to early repol.  Patient is young with thin chest wall, this is reasonable interpretation, overall with low clinical suspicion for acute pericarditis.  Received GI cocktail at urgent care which did provide some improvement per patient subjectively although having some return of pain at this time  Physical Exam: Physical exam performed. The pertinent findings include: Overall stable vital signs other than mildly hypertensive blood pressure on recheck at 144/97 although normotensive on arrival at 127/77.  Normal pulse rate, stable oxygen  saturation on room air, no significant tenderness to palpation of chest wall or epigastric region.  Lab Tests/Imaging studies: I personally interpreted labs/imaging and the pertinent results include: Troponin undetectable x 1 in context of chest pain ongoing for 1 to 2 weeks.  CBC unremarkable, BMP unremarkable.  I independently interpreted plain film chest x-ray which shows no evidence of acute intrathoracic abnormality. I agree with the radiologist interpretation.  Cardiac monitoring: EKG obtained and interpreted by myself and attending physician which shows: Normal sinus rhythm, some diffuse ST elevation, likely secondary to the early repol pattern, overall low clinical suspicion for pericarditis with exam and history   Medications: Encouraged continued Pepcid use, will discharge with viscous lidocaine for patient to create at home GI cocktail, encouraged GI follow-up.  Discussed food choices for GERD, and encouraged stress relief as able to   Disposition: After consideration of the diagnostic results and the patients response to treatment, I feel that patient is stable for discharge, chest pain consistent with GERD, possible developing ulcer. No evidence of ACS, perc negative for PE.  emergency department workup does not suggest an emergent condition  requiring admission or immediate intervention beyond what has been performed at this time. The plan is: as above. The patient is safe for discharge and has been instructed to return immediately for worsening symptoms, change in symptoms or any other concerns.  Final Clinical Impression(s) / ED Diagnoses Final diagnoses:  Chest pain, unspecified type  Gastroesophageal reflux disease without esophagitis    Rx / DC Orders ED Discharge Orders          Ordered    lidocaine (XYLOCAINE) 2 % solution  As needed        09/10/22 1403              Neiko Trivedi, Harrel Carina, PA-C 09/10/22 1418    Jacalyn Lefevre, MD 09/11/22 1037

## 2022-09-10 NOTE — ED Provider Notes (Signed)
[facility]    CSN: [id number] Arrival date & time: 09/10/22  1023      History   Chief Complaint Chief Complaint  Patient presents with   Heartburn    I have had chest pain for more than a week now. - Entered by patient    HPI Steven Walls is a 37 y.o. male presents for evaluation of chest pain.  Patient reports 1 week of a intermittent midsternal chest pain that he described as a burning/sharp pain sometimes radiates to the right and to the left side of his chest.  It is associated with dizziness but he denies any shortness of breath, nausea/vomiting, palpitations, syncope, diaphoresis.  Rates the pain anywhere from a 3-7 out of 10.  He is a previous smoker quit 1 year ago.  Denies any medical history including diabetes, hypertension, hyperlipidemia.  Denies any family history of CAD.  He does state he started to go on a diet and has been restricting his intake and initially was drinking a lot of coffee which she thought was contributing.  He stopped drinking the coffee and had no change in his symptoms.  When he does eat it is typically spicy food but states that is not outside the norm for him denies any new medications.  He did start taking Tums and OTC Pepcid with minimal improvement.  He has never had symptoms like this in the past.  No other concerns at this time.   Heartburn Associated symptoms include chest pain.    Past Medical History:  Diagnosis Date   Asthma    Renal stones     There are no problems to display for this patient.   Past Surgical History:  Procedure Laterality Date   EXTRACORPOREAL SHOCK WAVE LITHOTRIPSY Right 03/29/2020   Procedure: EXTRACORPOREAL SHOCK WAVE LITHOTRIPSY (ESWL);  Surgeon: Belva Agee, MD;  Location: [facility];  Service: Urology;  Laterality: Right;   URETEROSCOPY         Home Medications    Prior to Admission medications   Medication Sig Start Date End Date Taking? Authorizing Provider   ALPRAZolam Prudy Feeler) 1 MG tablet Take 1 mg by mouth at bedtime as needed for anxiety.    [provider]  diphenhydrAMINE (BENADRYL) 25 mg capsule Take 25 mg by mouth every 6 (six) hours as needed.    [provider]  HYDROcodone-acetaminophen (NORCO/VICODIN) 5-325 MG tablet Take 1 tablet by mouth every 6 (six) hours as needed for severe pain. 05/01/22   Wallis Bamberg, PA-C  naproxen (NAPROSYN) 500 MG tablet Take 1 tablet (500 mg total) by mouth 2 (two) times daily with a meal. 05/01/22   Wallis Bamberg, PA-C  tamsulosin (FLOMAX) 0.4 MG CAPS capsule Take 1 capsule (0.4 mg total) by mouth daily. 05/01/22   Wallis Bamberg, PA-C    Family History History reviewed. No pertinent family history.  Social History Social History   Tobacco Use   Smoking status: Every Day   Smokeless tobacco: Never  Vaping Use   Vaping status: Never Used  Substance Use Topics   Alcohol use: No   Drug use: No     Allergies   Beta adrenergic blockers   Review of Systems Review of Systems  Cardiovascular:  Positive for chest pain.  Gastrointestinal:  Positive for heartburn.  Neurological:  Positive for dizziness.     Physical Exam Triage Vital Signs ED Triage Vitals [09/10/22 1034]  Encounter Vitals Group     BP 125/77  Systolic BP Percentile      Diastolic BP Percentile      Pulse Rate 65     Resp 18     Temp 98 F (36.7 C)     Temp Source Oral     SpO2 98 %     Weight      Height      Head Circumference      Peak Flow      Pain Score 4     Pain Loc      Pain Education      Exclude from Growth Chart    No data found.  Updated Vital Signs BP 125/77 (BP Location: Left Arm)   Pulse 65   Temp 98 F (36.7 C) (Oral)   Resp 18   SpO2 98%   Visual Acuity Right Eye Distance:   Left Eye Distance:   Bilateral Distance:    Right Eye Near:   Left Eye Near:    Bilateral Near:     Physical Exam Vitals and nursing note reviewed.  Constitutional:      General: He is not in  acute distress.    Appearance: Normal appearance. He is not ill-appearing, toxic-appearing or diaphoretic.  HENT:     Head: Normocephalic and atraumatic.  Eyes:     Pupils: Pupils are equal, round, and reactive to light.  Cardiovascular:     Rate and Rhythm: Normal rate and regular rhythm.     Heart sounds: Normal heart sounds. No murmur heard. Pulmonary:     Effort: Pulmonary effort is normal.     Breath sounds: Normal breath sounds.  Chest:       Comments: Chest pain is somewhat reproducible with palpation. Skin:    General: Skin is warm and dry.  Neurological:     General: No focal deficit present.     Mental Status: He is alert and oriented to person, place, and time.  Psychiatric:        Mood and Affect: Mood normal.        Behavior: Behavior normal.      UC Treatments / Results  Labs (all labs ordered are listed, but only abnormal results are displayed) Labs Reviewed  POCT FASTING CBG [facility] MANUAL ENTRY - Abnormal; Notable for the following components:      Result Value   POCT Glucose ([facility]) 129 (*)    All other components within normal limits    EKG   Radiology DG Chest 2 View  Result Date: 09/10/2022 CLINICAL DATA:  Chest pain EXAM: CHEST - 2 VIEW COMPARISON:  09/10/2022 FINDINGS: The heart size and mediastinal contours are within normal limits. Both lungs are clear. The visualized skeletal structures are unremarkable. IMPRESSION: No active cardiopulmonary disease. Electronically Signed   By: Duanne Guess D.O.   On: 09/10/2022 12:36   DG Chest 2 View  Result Date: 09/10/2022 CLINICAL DATA:  Chest pain EXAM: CHEST - 2 VIEW COMPARISON:  None Available. FINDINGS: The heart size and mediastinal contours are within normal limits. Both lungs are clear. The visualized skeletal structures are unremarkable. IMPRESSION: No active cardiopulmonary disease. Electronically Signed   By: Duanne Guess D.O.   On: 09/10/2022 12:35    Procedures ED EKG  Date/Time:  09/10/2022 11:20 AM  Performed by: Radford Pax, NP Authorized by: Radford Pax, NP   ECG interpreted by ED Physician in the absence of a cardiologist: no   Previous ECG:    Previous ECG:  Unavailable  Interpretation:    Interpretation: abnormal   Rate:    ECG rate:  60 Rhythm:    Rhythm: sinus rhythm   Ectopy:    Ectopy: none   ST segments:    ST segments:  Non-specific T waves:    T waves: non-specific    (including critical care time)  Medications Ordered in UC Medications  lidocaine (XYLOCAINE) 2 % viscous mouth solution 15 mL (15 mLs Mouth/Throat Given 09/10/22 1110)  alum & mag hydroxide-simeth (MAALOX/MYLANTA) 200-200-20 MG/5ML suspension 30 mL (30 mLs Oral Given 09/10/22 1110)    Initial Impression / Assessment and Plan / UC Course  I have reviewed the triage vital signs and the nursing notes.  Pertinent labs & imaging results that were available during my care of the patient were reviewed by me and considered in my medical decision making (see chart for details).     Reviewed exam and symptoms with patient.  Fasting glucose 129.  EKG some nonspecific ST-T wave changes.  Questionable pericarditis however no friction rub on exam and patient denies pain improving with sitting up or leaning forward.  Does state pain is better when he is warm.  No improvement with GI cocktail.  Unclear cause of his symptoms but given his presentation and EKG advised to go to the ER for further evaluation.  He is in agreement with plan and go POV to the ER. Final Clinical Impressions(s) / UC Diagnoses   Final diagnoses:  Chest pain, unspecified type  Dizziness     Discharge Instructions      Please go to the emergency-year-[facility] for further workup of your symptoms     ED Prescriptions   None    PDMP not reviewed this encounter.   Radford Pax, NP 09/10/22 1251

## 2022-09-10 NOTE — ED Triage Notes (Signed)
Pt presents with c/o his chest burning. Pt states it feels hot. Reports he woke up with it.   Pt states he ate pasta with sausage last night. States he has been taking pepcid and tums.

## 2022-09-10 NOTE — ED Notes (Signed)
Discharge paperwork reviewed entirely with patient, including follow up care. Pain was under control. The patient received instruction and coaching on their prescriptions, and all follow-up questions were answered.  Pt verbalized understanding as well as all parties involved. No questions or concerns voiced at the time of discharge. No acute distress noted.   Pt ambulated out to PVA without incident or assistance.  

## 2022-09-10 NOTE — ED Notes (Signed)
Patient is being discharged from the Urgent Care and sent to the Emergency Department via POV . Per Cheri Rous NP, patient is in need of higher level of care due to chest pain. Patient is aware and verbalizes understanding of plan of care.  Vitals:   09/10/22 1034  BP: 125/77  Pulse: 65  Resp: 18  Temp: 98 F (36.7 C)  SpO2: 98%

## 2022-10-16 ENCOUNTER — Ambulatory Visit (INDEPENDENT_AMBULATORY_CARE_PROVIDER_SITE_OTHER): Payer: Commercial Managed Care - PPO | Admitting: Family Medicine

## 2022-10-16 ENCOUNTER — Encounter: Payer: Self-pay | Admitting: Family Medicine

## 2022-10-16 VITALS — BP 114/72 | HR 60 | Temp 97.7°F | Ht 65.0 in | Wt 142.4 lb

## 2022-10-16 DIAGNOSIS — Z5181 Encounter for therapeutic drug level monitoring: Secondary | ICD-10-CM | POA: Insufficient documentation

## 2022-10-16 DIAGNOSIS — Z23 Encounter for immunization: Secondary | ICD-10-CM | POA: Diagnosis not present

## 2022-10-16 DIAGNOSIS — F988 Other specified behavioral and emotional disorders with onset usually occurring in childhood and adolescence: Secondary | ICD-10-CM | POA: Insufficient documentation

## 2022-10-16 DIAGNOSIS — Z862 Personal history of diseases of the blood and blood-forming organs and certain disorders involving the immune mechanism: Secondary | ICD-10-CM | POA: Diagnosis not present

## 2022-10-16 DIAGNOSIS — D518 Other vitamin B12 deficiency anemias: Secondary | ICD-10-CM

## 2022-10-16 DIAGNOSIS — K219 Gastro-esophageal reflux disease without esophagitis: Secondary | ICD-10-CM

## 2022-10-16 DIAGNOSIS — F1210 Cannabis abuse, uncomplicated: Secondary | ICD-10-CM

## 2022-10-16 DIAGNOSIS — F908 Attention-deficit hyperactivity disorder, other type: Secondary | ICD-10-CM | POA: Diagnosis not present

## 2022-10-16 DIAGNOSIS — N200 Calculus of kidney: Secondary | ICD-10-CM

## 2022-10-16 DIAGNOSIS — E7800 Pure hypercholesterolemia, unspecified: Secondary | ICD-10-CM

## 2022-10-16 DIAGNOSIS — R825 Elevated urine levels of drugs, medicaments and biological substances: Secondary | ICD-10-CM

## 2022-10-16 DIAGNOSIS — Z1159 Encounter for screening for other viral diseases: Secondary | ICD-10-CM

## 2022-10-16 DIAGNOSIS — D649 Anemia, unspecified: Secondary | ICD-10-CM | POA: Insufficient documentation

## 2022-10-16 DIAGNOSIS — Z8619 Personal history of other infectious and parasitic diseases: Secondary | ICD-10-CM

## 2022-10-16 LAB — MICROALBUMIN / CREATININE URINE RATIO
Creatinine,U: 109.4 mg/dL
Microalb Creat Ratio: 0.7 mg/g (ref 0.0–30.0)
Microalb, Ur: 0.8 mg/dL (ref 0.0–1.9)

## 2022-10-16 MED ORDER — FAMOTIDINE 20 MG PO TABS
20.0000 mg | ORAL_TABLET | Freq: Every day | ORAL | 3 refills | Status: AC | PRN
Start: 2022-10-16 — End: 2023-10-11

## 2022-10-16 NOTE — Assessment & Plan Note (Signed)
History of recurrent stones; manage conservatively at home when possible. Provided info for tamsulosin PRN if required. No current stones noted; follow-up as needed.

## 2022-10-16 NOTE — Assessment & Plan Note (Signed)
Controlled on Famotidine 20 mg once daily. Prescription for famotidine sent  Continue monitoring, lifestyle modifications advised (avoid triggers). Follow-up PRN.

## 2022-10-16 NOTE — Assessment & Plan Note (Signed)
Patient desires change to brand name Adderall. Conduct urine drug screening and controlled substance waiver signed. Referral to psychiatrist for formal ADHD evaluation. Pending diagnosis confirmation, consider prescription for Adderall.

## 2022-10-16 NOTE — Progress Notes (Signed)
Assessment/Plan:   Problem List Items Addressed This Visit       Digestive   Gastroesophageal reflux disease - Primary    Controlled on Famotidine 20 mg once daily. Prescription for famotidine sent  Continue monitoring, lifestyle modifications advised (avoid triggers). Follow-up PRN.      Relevant Medications   famotidine (PEPCID) 20 MG tablet   Other Relevant Orders   Vitamin D 1,25 dihydroxy     Genitourinary   Recurrent nephrolithiasis    History of recurrent stones; manage conservatively at home when possible. Provided info for tamsulosin PRN if required. No current stones noted; follow-up as needed.      Relevant Orders   Vitamin D 1,25 dihydroxy   Uric acid   Urinalysis w microscopic + reflex cultur     Other   Attention deficit disorder    Patient desires change to brand name Adderall. Conduct urine drug screening and controlled substance waiver signed. Referral to psychiatrist for formal ADHD evaluation. Pending diagnosis confirmation, consider prescription for Adderall.      Relevant Orders   Vitamin D 1,25 dihydroxy   Ambulatory referral to Behavioral Health   Iron and TIBC   Ferritin   History of iron deficiency anemia from gastritis   Relevant Orders   CBC with Differential/Platelet   History of Helicobacter pylori infection   Absolute anemia    History of IDA and B12 deficiency  resolved based on recent hemoglobin of 15. Monitor diet and symptoms. No current supplements needed unless symptoms reappear. Plan to reassess in upcoming physical, including B12 and iron studies.      Relevant Orders   Vitamin B12   Folate   Encounter for therapeutic drug monitoring   Relevant Orders   ToxAssure Flex 23, Ur   Pure hypercholesterolemia   Relevant Orders   TSH   Lipid panel   Hemoglobin A1c   Comprehensive metabolic panel   Microalbumin / creatinine urine ratio (Completed)   Other Visit Diagnoses     Immunization due       Relevant Orders    Flu vaccine trivalent PF, 6mos and older(Flulaval,Afluria,Fluarix,Fluzone) (Completed)   Screening for viral disease       Relevant Orders   HIV Antibody (routine testing w rflx)   HCV Ab w Reflex to Quant PCR       Medications Discontinued During This Encounter  Medication Reason   ALPRAZolam (XANAX) 1 MG tablet Patient Preference   diphenhydrAMINE (BENADRYL) 25 mg capsule    lidocaine (XYLOCAINE) 2 % solution    naproxen (NAPROSYN) 500 MG tablet    tamsulosin (FLOMAX) 0.4 MG CAPS capsule    HYDROcodone-acetaminophen (NORCO/VICODIN) 5-325 MG tablet    Famotidine (PEPCID PO)     Return in about 4 weeks (around 11/13/2022) for physical .    Subjective:   Encounter date: 10/16/2022  Steven Walls is a 37 y.o. male who has Gastroesophageal reflux disease; Attention deficit disorder; History of iron deficiency anemia from gastritis; History of Helicobacter pylori infection; Absolute anemia; Recurrent nephrolithiasis; Encounter for therapeutic drug monitoring; and Pure hypercholesterolemia on their problem list..   He  has a past medical history of Asthma and Renal stones.Steven Walls   He presents with chief complaint of Establish Care (Discuss medications. Fasting. Would like a CPE if possible. Hx of anemia.) .   Chief Complaint: Establishing care, requesting a physical exam, concerns about iron deficiency anemia, medication for heartburn and ADHD management.  History of Present Illness:  Iron Deficiency and  B12 anemia. Patient reports a history of iron and B12 deficiency discovered to years ago year.  Previously treated with supplements and injections, however, patient is not currently taking supplements. Patient is asymptomatic and had a normal hemoglobin result (15) in recent emergency department visit.  Heartburn. Patient has a history of heartburn, which is currently stable on over-the-counter Pepcid (famotidine) 20 mg once daily. Patient reports improvement since starting  Pepcid after having previously been treated for H. Pylori with antibiotics and omeprazole. An EGD and colonoscopy in 2022 showed gastritis but no ulcers, colon screen negative.  1 month ago, patient had a flare leading to an emergency visit where GI cocktail was administered, providing symptom relief.  ED workup reassuring for noncardiac causes of chest pain including negative troponins and EKG that had diffuse ST changes, but appeared to be repolarization.  Patient has no chest pain.  Attention Deficit Hyperactivity Disorder (ADHD). Patient previously on generic Adderall (amphetamine and dextroamphetamine), taking 10 mg daily as needed, abstaining on weekends. Requests switching to brand-name Adderall for better symptom control.  Kidney Stones. History of recurrent kidney stones with 19 stones passed to date. Has had a ureteroscopy and shockwave lithotripsy in the past. Recent emergency visit for suspected renal stone, managed conservatively.     10/16/2022    9:11 AM  Depression screen PHQ 2/9  Decreased Interest 1  Down, Depressed, Hopeless 1  PHQ - 2 Score 2  Altered sleeping 0  Tired, decreased energy 1  Change in appetite 2  Feeling bad or failure about yourself  1  Trouble concentrating 3  Moving slowly or fidgety/restless 0  Suicidal thoughts 0  PHQ-9 Score 9  Difficult doing work/chores Not difficult at all   Adult ADHD Self Report Scale (most recent)     Adult ADHD Self-Report Scale (ASRS-v1.1) Symptom Checklist - 10/16/22 0911       Part A   1. How often do you have trouble wrapping up the final details of a project, once the challenging parts have been done? Often  2. How often do you have difficulty getting things done in order when you have to do a task that requires organization? Sometimes    3. How often do you have problems remembering appointments or obligations? Often  4. When you have a task that requires a lot of thought, how often do you avoid or delay getting  started? Very Often    5. How often do you fidget or squirm with your hands or feet when you have to sit down for a long time? Very Often  6. How often do you feel overly active and compelled to do things, like you were driven by a motor? Rarely      Part B   7. How often do you make careless mistakes when you have to work on a boring or difficult project? Sometimes  8. How often do you have difficulty keeping your attention when you are doing boring or repetitive work? Often    9. How often do you have difficulty concentrating on what people say to you, even when they are speaking to you directly? Often  10. How often do you misplace or have difficulty finding things at home or at work? Very Often    11. How often are you distracted by activity or noise around you? Rarely  12. How often do you leave your seat in meetings or other situations in which you are expected to remain seated? Never  13. How often do you feel restless or fidgety? Rarely  14. How often do you have difficulty unwinding and relaxing when you have time to yourself? Never    15. How often do you find yourself talking too much when you are in social situations? Rarely  16. When you are in a conversation, how often do you find yourself finishing the sentences of the people you are talking to, before they can finish them themselves? Never    17. How often do you have difficulty waiting your turn in situations when turn taking is required? Rarely  18. How often do you interrupt others when they are busy? Rarely               10/16/2022    9:12 AM  GAD 7 : Generalized Anxiety Score  Nervous, Anxious, on Edge 2  Control/stop worrying 2  Worry too much - different things 3  Trouble relaxing 1  Restless 0  Easily annoyed or irritable 1  Afraid - awful might happen 1  Total GAD 7 Score 10  Anxiety Difficulty Not difficult at all     ROS  Past Surgical History:  Procedure Laterality Date   EXTRACORPOREAL SHOCK WAVE  LITHOTRIPSY Right 03/29/2020   Procedure: EXTRACORPOREAL SHOCK WAVE LITHOTRIPSY (ESWL);  Surgeon: Belva Agee, MD;  Location: [facility];  Service: Urology;  Laterality: Right;   URETEROSCOPY      Outpatient Medications Prior to Visit  Medication Sig Dispense Refill   amphetamine-dextroamphetamine (ADDERALL) 10 MG tablet Take by mouth.     Famotidine (PEPCID PO) Take by mouth.     HYDROcodone-acetaminophen (NORCO/VICODIN) 5-325 MG tablet Take 1 tablet by mouth every 6 (six) hours as needed for severe pain. 15 tablet 0   ALPRAZolam (XANAX) 1 MG tablet Take 1 mg by mouth at bedtime as needed for anxiety. (Patient not taking: Reported on 10/16/2022)     diphenhydrAMINE (BENADRYL) 25 mg capsule Take 25 mg by mouth every 6 (six) hours as needed. (Patient not taking: Reported on 10/16/2022)     lidocaine (XYLOCAINE) 2 % solution Use as directed 15 mLs in the mouth or throat as needed for mouth pain. (Patient not taking: Reported on 10/16/2022) 100 mL 0   naproxen (NAPROSYN) 500 MG tablet Take 1 tablet (500 mg total) by mouth 2 (two) times daily with a meal. (Patient not taking: Reported on 10/16/2022) 30 tablet 0   tamsulosin (FLOMAX) 0.4 MG CAPS capsule Take 1 capsule (0.4 mg total) by mouth daily. (Patient not taking: Reported on 10/16/2022) 30 capsule 0   No facility-administered medications prior to visit.    History reviewed. No pertinent family history.  Social History   Socioeconomic History   Marital status: Married    Spouse name: Not on file   Number of children: Not on file   Years of education: Not on file   Highest education level: Not on file  Occupational History   Not on file  Tobacco Use   Smoking status: Never    Passive exposure: Never   Smokeless tobacco: Never  Vaping Use   Vaping status: Never Used  Substance and Sexual Activity   Alcohol use: No   Drug use: No   Sexual activity: Yes  Other Topics Concern   Not on file  Social History  Narrative   Not on file   Social Determinants of Health   Financial Resource Strain: Not on file  Food Insecurity: Not on file  Transportation Needs: Not on file  Physical Activity: Not on file  Stress: Not on file  Social Connections: Unknown (06/11/2021)   Received from [facility], [facility]   Social Network    Social Network: Not on file  Intimate Partner Violence: Unknown (05/03/2021)   Received from [facility], [facility]   HITS    Physically Hurt: Not on file    Insult or Talk Down To: Not on file    Threaten Physical Harm: Not on file    Scream or Curse: Not on file                                                                                                  Objective:  Physical Exam: BP 114/72 (BP Location: Left Arm, Patient Position: Sitting, Cuff Size: Large)   Pulse 60   Temp 97.7 F (36.5 C) (Temporal)   Ht 5\' 5"  (1.651 m)   Wt 142 lb 6.4 oz (64.6 kg)   SpO2 99%   BMI 23.70 kg/m     Physical Exam Constitutional:      Appearance: Normal appearance.  HENT:     Head: Normocephalic and atraumatic.     Right Ear: Hearing normal.     Left Ear: Hearing normal.     Nose: Nose normal.  Eyes:     General: No scleral icterus.       Right eye: No discharge.        Left eye: No discharge.     Extraocular Movements: Extraocular movements intact.  Cardiovascular:     Rate and Rhythm: Normal rate and regular rhythm.     Heart sounds: Normal heart sounds.  Pulmonary:     Effort: Pulmonary effort is normal.     Breath sounds: Normal breath sounds.  Abdominal:     Palpations: Abdomen is soft.     Tenderness: There is no abdominal tenderness.  Musculoskeletal:     Right lower leg: No edema.     Left lower leg: No edema.  Skin:    General: Skin is warm.     Findings: No rash.  Neurological:     General: No focal deficit present.     Mental Status: He is alert.     Cranial Nerves: No cranial nerve deficit.  Psychiatric:        Mood and  Affect: Mood normal.        Behavior: Behavior normal.        Thought Content: Thought content normal.        Judgment: Judgment normal.     DG Chest 2 View  Result Date: 09/10/2022 CLINICAL DATA:  Chest pain EXAM: CHEST - 2 VIEW COMPARISON:  09/10/2022 FINDINGS: The heart size and mediastinal contours are within normal limits. Both lungs are clear. The visualized skeletal structures are unremarkable. IMPRESSION: No active cardiopulmonary disease. Electronically Signed   By: Duanne Guess D.O.   On: 09/10/2022 12:36   DG Chest 2 View  Result Date: 09/10/2022 CLINICAL DATA:  Chest pain EXAM: CHEST - 2 VIEW COMPARISON:  None Available. FINDINGS: The  heart size and mediastinal contours are within normal limits. Both lungs are clear. The visualized skeletal structures are unremarkable. IMPRESSION: No active cardiopulmonary disease. Electronically Signed   By: Duanne Guess D.O.   On: 09/10/2022 12:35    Recent Results (from the past 2160 hour(s))  POCT CBG (manual entry)     Status: Abnormal   Collection Time: 09/10/22 11:09 AM  Result Value Ref Range   POCT Glucose ([facility]) 129 (A) 70 - 99 mg/dL  Basic metabolic panel     Status: None   Collection Time: 09/10/22 12:07 PM  Result Value Ref Range   Sodium 138 135 - 145 mmol/L   Potassium 3.6 3.5 - 5.1 mmol/L   Chloride 105 98 - 111 mmol/L   CO2 23 22 - 32 mmol/L   Glucose, Bld 94 70 - 99 mg/dL    Comment: Glucose reference range applies only to samples taken after fasting for at least 8 hours.   BUN 11 6 - 20 mg/dL   Creatinine, Ser 9.62 0.61 - 1.24 mg/dL   Calcium 9.2 8.9 - 95.2 mg/dL   GFR, Estimated >84 >13 mL/min    Comment: (NOTE) Calculated using the CKD-EPI Creatinine Equation (2021)    Anion gap 10 5 - 15    Comment: Performed at [facility], [address] [place], [place] [address]  CBC     Status: Abnormal   Collection Time: 09/10/22 12:07 PM  Result Value Ref Range   WBC 3.9 (L) 4.0 - 10.5 K/uL   RBC  5.59 4.22 - 5.81 MIL/uL   Hemoglobin 15.5 13.0 - 17.0 g/dL   HCT 02.7 25.3 - 66.4 %   MCV 80.0 80.0 - 100.0 fL   MCH 27.7 26.0 - 34.0 pg   MCHC 34.7 30.0 - 36.0 g/dL   RDW 40.3 47.4 - 25.9 %   Platelets 293 150 - 400 K/uL   nRBC 0.0 0.0 - 0.2 %    Comment: Performed at [facility], [address] [place], [place] [address]  Troponin I (High Sensitivity)     Status: None   Collection Time: 09/10/22 12:07 PM  Result Value Ref Range   Troponin I (High Sensitivity) <2 <18 ng/L    Comment: (NOTE) Elevated high sensitivity troponin I (hsTnI) values and significant  changes across serial measurements may suggest ACS but many other  chronic and acute conditions are known to elevate hsTnI results.  Refer to the "Links" section for chest pain algorithms and additional  guidance. Performed at [facility], [address] [place], [place] [address]   Microalbumin / creatinine urine ratio     Status: None   Collection Time: 10/16/22  9:25 AM  Result Value Ref Range   Microalb, Ur 0.8 0.0 - 1.9 mg/dL   Creatinine,U 295.1 mg/dL   Microalb Creat Ratio 0.7 0.0 - 30.0 mg/g        Garner Nash, MD, MS

## 2022-10-16 NOTE — Assessment & Plan Note (Signed)
History of IDA and B12 deficiency  resolved based on recent hemoglobin of 15. Monitor diet and symptoms. No current supplements needed unless symptoms reappear. Plan to reassess in upcoming physical, including B12 and iron studies.

## 2022-10-17 LAB — URINALYSIS W MICROSCOPIC + REFLEX CULTURE
Bacteria, UA: NONE SEEN /HPF
Bilirubin Urine: NEGATIVE
Glucose, UA: NEGATIVE
Hgb urine dipstick: NEGATIVE
Hyaline Cast: NONE SEEN /LPF
Ketones, ur: NEGATIVE
Leukocyte Esterase: NEGATIVE
Nitrites, Initial: NEGATIVE
Protein, ur: NEGATIVE
RBC / HPF: NONE SEEN /HPF (ref 0–2)
Specific Gravity, Urine: 1.014 (ref 1.001–1.035)
Squamous Epithelial / HPF: NONE SEEN /HPF (ref <=5)
WBC, UA: NONE SEEN /HPF (ref 0–5)
pH: 6.5 (ref 5.0–8.0)

## 2022-10-17 LAB — NO CULTURE INDICATED

## 2022-10-20 DIAGNOSIS — F1210 Cannabis abuse, uncomplicated: Secondary | ICD-10-CM | POA: Insufficient documentation

## 2022-10-20 LAB — TOXASSURE FLEX 23, UR
6-ACETYLMORPHINE IA: NEGATIVE ng/mL
BARBITURATES IA: NEGATIVE ng/mL
CARISOPRODOL IA: NEGATIVE ng/mL
COCAINE METABOLITE IA: NEGATIVE ng/mL
Creatinine: 111 mg/dL
ETHANOL BIOMARKERS IA: NEGATIVE ng/mL
ETHYL ALCOHOL Enzymatic: NEGATIVE g/dL
MEPERIDINE IA: NEGATIVE ng/mL
METHADONE IA: NEGATIVE ng/mL
METHADONE MTB IA: NEGATIVE ng/mL
OPIATE CLASS IA: NEGATIVE ng/mL
OTHER HALLUCINOGENS: NEGATIVE
OXYCODONE CLASS IA: NEGATIVE ng/mL
PHENCYCLIDINE IA: NEGATIVE ng/mL
PROPOXYPHENE IA: NEGATIVE ng/mL
TAPENTADOL, IA: NEGATIVE ng/mL
TRAMADOL IA: NEGATIVE ng/mL

## 2022-10-20 LAB — AMPHETAMINES, MS, UR RFX: Amphetamine: 922 ng/mg{creat}

## 2022-10-20 LAB — CANNABINOIDS, MS, UR RFX

## 2022-10-20 NOTE — Addendum Note (Signed)
Addended by: Garnette Gunner on: 10/20/2022 04:52 PM   Modules accepted: Orders

## 2022-10-21 ENCOUNTER — Telehealth: Payer: Self-pay | Admitting: Family Medicine

## 2022-10-22 ENCOUNTER — Telehealth: Payer: Self-pay | Admitting: Family Medicine

## 2022-10-22 NOTE — Telephone Encounter (Signed)
error 

## 2022-10-22 NOTE — Telephone Encounter (Signed)
ERROR

## 2022-10-30 ENCOUNTER — Ambulatory Visit: Payer: Commercial Managed Care - PPO | Admitting: Family Medicine

## 2022-10-31 ENCOUNTER — Encounter: Payer: Self-pay | Admitting: Family Medicine

## 2022-11-12 ENCOUNTER — Encounter: Payer: Self-pay | Admitting: Family Medicine

## 2022-11-12 ENCOUNTER — Ambulatory Visit (INDEPENDENT_AMBULATORY_CARE_PROVIDER_SITE_OTHER): Payer: Commercial Managed Care - PPO | Admitting: Family Medicine

## 2022-11-12 VITALS — BP 108/70 | HR 66 | Temp 96.9°F | Resp 18 | Ht 65.0 in | Wt 140.6 lb

## 2022-11-12 DIAGNOSIS — E7800 Pure hypercholesterolemia, unspecified: Secondary | ICD-10-CM

## 2022-11-12 DIAGNOSIS — D518 Other vitamin B12 deficiency anemias: Secondary | ICD-10-CM | POA: Diagnosis not present

## 2022-11-12 DIAGNOSIS — L989 Disorder of the skin and subcutaneous tissue, unspecified: Secondary | ICD-10-CM

## 2022-11-12 DIAGNOSIS — K219 Gastro-esophageal reflux disease without esophagitis: Secondary | ICD-10-CM | POA: Diagnosis not present

## 2022-11-12 DIAGNOSIS — F908 Attention-deficit hyperactivity disorder, other type: Secondary | ICD-10-CM

## 2022-11-12 DIAGNOSIS — N200 Calculus of kidney: Secondary | ICD-10-CM | POA: Diagnosis not present

## 2022-11-12 DIAGNOSIS — Z1159 Encounter for screening for other viral diseases: Secondary | ICD-10-CM

## 2022-11-12 DIAGNOSIS — Z0000 Encounter for general adult medical examination without abnormal findings: Secondary | ICD-10-CM

## 2022-11-12 DIAGNOSIS — Z862 Personal history of diseases of the blood and blood-forming organs and certain disorders involving the immune mechanism: Secondary | ICD-10-CM | POA: Diagnosis not present

## 2022-11-12 LAB — CBC WITH DIFFERENTIAL/PLATELET
Basophils Absolute: 0 10*3/uL (ref 0.0–0.1)
Basophils Relative: 0.8 % (ref 0.0–3.0)
Eosinophils Absolute: 0.2 10*3/uL (ref 0.0–0.7)
Eosinophils Relative: 3.4 % (ref 0.0–5.0)
HCT: 48.9 % (ref 39.0–52.0)
Hemoglobin: 15.6 g/dL (ref 13.0–17.0)
Lymphocytes Relative: 34.1 % (ref 12.0–46.0)
Lymphs Abs: 1.7 10*3/uL (ref 0.7–4.0)
MCHC: 31.9 g/dL (ref 30.0–36.0)
MCV: 85.9 fL (ref 78.0–100.0)
Monocytes Absolute: 0.5 10*3/uL (ref 0.1–1.0)
Monocytes Relative: 9.5 % (ref 3.0–12.0)
Neutro Abs: 2.6 10*3/uL (ref 1.4–7.7)
Neutrophils Relative %: 52.2 % (ref 43.0–77.0)
Platelets: 301 10*3/uL (ref 150.0–400.0)
RBC: 5.7 Mil/uL (ref 4.22–5.81)
RDW: 12.6 % (ref 11.5–15.5)
WBC: 5 10*3/uL (ref 4.0–10.5)

## 2022-11-12 LAB — COMPREHENSIVE METABOLIC PANEL
ALT: 20 U/L (ref 0–53)
AST: 19 U/L (ref 0–37)
Albumin: 4.8 g/dL (ref 3.5–5.2)
Alkaline Phosphatase: 68 U/L (ref 39–117)
BUN: 11 mg/dL (ref 6–23)
CO2: 30 meq/L (ref 19–32)
Calcium: 9.8 mg/dL (ref 8.4–10.5)
Chloride: 106 meq/L (ref 96–112)
Creatinine, Ser: 0.83 mg/dL (ref 0.40–1.50)
GFR: 111.16 mL/min (ref >60.00)
Glucose, Bld: 96 mg/dL (ref 70–99)
Potassium: 4.6 meq/L (ref 3.5–5.1)
Sodium: 140 meq/L (ref 135–145)
Total Bilirubin: 1.1 mg/dL (ref 0.2–1.2)
Total Protein: 7.7 g/dL (ref 6.0–8.3)

## 2022-11-12 LAB — HEMOGLOBIN A1C: Hgb A1c MFr Bld: 4.3 % — ABNORMAL LOW (ref 4.6–6.5)

## 2022-11-12 LAB — LIPID PANEL
Cholesterol: 181 mg/dL (ref 0–200)
HDL: 58.1 mg/dL (ref >39.00)
LDL Cholesterol: 113 mg/dL — ABNORMAL HIGH (ref 0–99)
NonHDL: 122.45
Total CHOL/HDL Ratio: 3
Triglycerides: 46 mg/dL (ref 0.0–149.0)
VLDL: 9.2 mg/dL (ref 0.0–40.0)

## 2022-11-12 LAB — FERRITIN: Ferritin: 93 ng/mL (ref 22.0–322.0)

## 2022-11-12 LAB — FOLATE: Folate: 12.2 ng/mL (ref >5.9)

## 2022-11-12 LAB — VITAMIN B12: Vitamin B-12: 206 pg/mL — ABNORMAL LOW (ref 211–911)

## 2022-11-12 LAB — TSH: TSH: 1.56 u[IU]/mL (ref 0.35–5.50)

## 2022-11-12 LAB — URIC ACID: Uric Acid, Serum: 3.7 mg/dL — ABNORMAL LOW (ref 4.0–7.8)

## 2022-11-12 NOTE — Progress Notes (Signed)
Assessment  Assessment/Plan:   Problem List Items Addressed This Visit       Digestive   Gastroesophageal reflux disease    Controlled on Famotidine 20 mg once daily. Continue monitoring, lifestyle modifications advised Follow-up PRN.        Musculoskeletal and Integument   Skin lesion of right arm - Primary    Darkened spot, likely benign. Chronic Condition Status: Stable.  Plan: Document and monitor for any changes. Consider dermatology consult if significant changes are noted.        Genitourinary   Recurrent nephrolithiasis     Other   Attention deficit disorder    Diagnosed and managed with integrated behavioral health. Chronic Condition Status: Managed. Plan: Continue current management plan. Patient to follow up with the therapist on October 30th for medication review.      History of iron deficiency anemia from gastritis    Requires periodic monitoring. Plan: Order lab work to check current levels.      Absolute anemia   Pure hypercholesterolemia   Other Visit Diagnoses     Screening for viral disease           There are no discontinued medications.  Patient Counseling(The following topics were reviewed and/or handout was given):  -Nutrition: Stressed importance of moderation in sodium/caffeine intake, saturated fat and cholesterol, caloric balance, sufficient intake of fresh fruits, vegetables, and fiber.  -Stressed the importance of regular exercise.   -Substance Abuse: Discussed cessation/primary prevention of tobacco, alcohol, or other drug use; driving or other dangerous activities under the influence; availability of treatment for abuse.   -Injury prevention: Discussed safety belts, safety helmets, smoke detector, smoking near bedding or upholstery.   -Sexuality: Discussed sexually transmitted diseases, partner selection, use of condoms, avoidance of unintended pregnancy and contraceptive alternatives.   -Dental health: Discussed  importance of regular tooth brushing, flossing, and dental visits.  -Health maintenance and immunizations reviewed. Please refer to Health maintenance section.  Return to care in 1 year for next preventative visit.        Subjective:  Chief complaint Encounter date: 11/12/2022  Chief Complaint  Patient presents with   Annual Exam    PT is here for annual physical    Steven Walls is a 37 y.o. male who presents today for his annual comprehensive physical exam.    They reported a follow-up on a recent ADHD diagnosis.  Patient is established with outside clinic for diagnosis and medication management.  Patient was previously prescribed Pepcid for heartburn, dosed at 20 mg daily, which they have been taking continuously with good effect. Additionally, patient has a history of several kidney stones, but currently no issues. There is also a history of mild iron deficiency anemia and possibly vitamin B12 deficiency, both requiring periodic lab evaluations. The patient fasted for lab work today.  Lifestyle:  Diet: Described as variable depending on workday vs. weekend. Prefers protein sources like chicken and beef, frequent intake of rice, pasta, and vegetables. Admits to skipping meals due to work focus, leading to GERD when not adequately managed. Exercise: Walks twice daily.  Review of Systems  Constitutional:  Negative for chills, diaphoresis, fever, malaise/fatigue and weight loss.  HENT:  Negative for congestion, ear discharge, ear pain and hearing loss.   Eyes:  Negative for blurred vision, double vision, photophobia, pain, discharge and redness.  Respiratory:  Negative for cough, sputum production, shortness of breath and wheezing.   Cardiovascular:  Negative for chest pain and palpitations.  Gastrointestinal:  Positive for heartburn. Negative for abdominal pain, blood in stool, constipation, diarrhea, melena, nausea and vomiting.  Genitourinary:  Negative for dysuria, flank pain,  frequency, hematuria and urgency.  Musculoskeletal:  Negative for myalgias.  Skin:  Negative for itching.       Patient noted a darkened spot on the right forearm, present for 2 years, non-changing, non-painful.  Neurological:  Negative for dizziness, tingling, tremors, speech change, seizures, loss of consciousness, weakness and headaches.  Psychiatric/Behavioral:  Positive for substance abuse (positive urine drug screen for cannabinoids, reported as a one-time use of Delta-8 THC.). Negative for depression, hallucinations, memory loss and suicidal ideas. The patient does not have insomnia.   All other systems reviewed and are negative.      11/12/2022    8:25 AM 11/12/2022    8:24 AM 10/16/2022    9:11 AM  Depression screen PHQ 2/9  Decreased Interest 0 0 1  Down, Depressed, Hopeless 0 0 1  PHQ - 2 Score 0 0 2  Altered sleeping 1 1 0  Tired, decreased energy 1 1 1   Change in appetite 0 0 2  Feeling bad or failure about yourself  0 0 1  Trouble concentrating 0 0 3  Moving slowly or fidgety/restless 0 0 0  Suicidal thoughts 0 0 0  PHQ-9 Score 2 2 9   Difficult doing work/chores Not difficult at all Not difficult at all Not difficult at all       11/12/2022    8:25 AM 10/16/2022    9:12 AM  GAD 7 : Generalized Anxiety Score  Nervous, Anxious, on Edge 1 2  Control/stop worrying 0 2  Worry too much - different things 0 3  Trouble relaxing 0 1  Restless 0 0  Easily annoyed or irritable 1 1  Afraid - awful might happen 0 1  Total GAD 7 Score 2 10  Anxiety Difficulty Not difficult at all Not difficult at all    There are no preventive care reminders to display for this patient.      PMH:  The following were reviewed and entered/updated in epic: Past Medical History:  Diagnosis Date   Asthma    Renal stones     Patient Active Problem List   Diagnosis Date Noted   Skin lesion of right arm 11/14/2022   Tetrahydrocannabinol (THC) use disorder, mild, abuse 10/20/2022    Gastroesophageal reflux disease 10/16/2022   Attention deficit disorder 10/16/2022   History of iron deficiency anemia from gastritis 10/16/2022   History of Helicobacter pylori infection 10/16/2022   Absolute anemia 10/16/2022   Recurrent nephrolithiasis 10/16/2022   Encounter for therapeutic drug monitoring 10/16/2022   Pure hypercholesterolemia 10/16/2022    Past Surgical History:  Procedure Laterality Date   EXTRACORPOREAL SHOCK WAVE LITHOTRIPSY Right 03/29/2020   Procedure: EXTRACORPOREAL SHOCK WAVE LITHOTRIPSY (ESWL);  Surgeon: Belva Agee, MD;  Location: [facility];  Service: Urology;  Laterality: Right;   URETEROSCOPY      No family history on file.  Medications- reviewed and updated Outpatient Medications Prior to Visit  Medication Sig Dispense Refill   albuterol (VENTOLIN HFA) 108 (90 Base) MCG/ACT inhaler Inhale 1 puff into the lungs.     famotidine (PEPCID) 20 MG tablet Take 1 tablet (20 mg total) by mouth daily as needed for heartburn or indigestion. 90 tablet 3   No facility-administered medications prior to visit.    Allergies  Allergen Reactions   Beta Adrenergic Blockers Other (See Comments)  Other reaction(s): None  Contraindicated with allergy immunotherapy    Social History   Socioeconomic History   Marital status: Married    Spouse name: Not on file   Number of children: Not on file   Years of education: Not on file   Highest education level: Not on file  Occupational History   Not on file  Tobacco Use   Smoking status: Never    Passive exposure: Never   Smokeless tobacco: Never  Vaping Use   Vaping status: Never Used  Substance and Sexual Activity   Alcohol use: No   Drug use: No   Sexual activity: Yes  Other Topics Concern   Not on file  Social History Narrative   Not on file   Social Determinants of Health   Financial Resource Strain: Not on file  Food Insecurity: Not on file  Transportation Needs: Not on  file  Physical Activity: Not on file  Stress: Not on file  Social Connections: Unknown (06/11/2021)   Received from [facility]   Social Network    Social Network: Not on file           Objective:  Physical Exam: BP 108/70 (BP Location: Left Arm, Patient Position: Sitting, Cuff Size: Normal)   Pulse 66   Temp (!) 96.9 F (36.1 C) (Temporal)   Resp 18   Ht 5\' 5"  (1.651 m)   Wt 140 lb 9.6 oz (63.8 kg)   SpO2 100%   BMI 23.40 kg/m   Body mass index is 23.4 kg/m. Wt Readings from Last 3 Encounters:  11/12/22 140 lb 9.6 oz (63.8 kg)  10/16/22 142 lb 6.4 oz (64.6 kg)  09/10/22 140 lb (63.5 kg)    Physical Exam Constitutional:      General: He is not in acute distress.    Appearance: Normal appearance. He is not ill-appearing or toxic-appearing.  HENT:     Head: Normocephalic and atraumatic.     Right Ear: Hearing, tympanic membrane, ear canal and external ear normal. There is no impacted cerumen.     Left Ear: Hearing, tympanic membrane, ear canal and external ear normal. There is no impacted cerumen.     Nose: Nose normal. No congestion.     Mouth/Throat:     Lips: No lesions.     Mouth: Mucous membranes are moist.     Pharynx: Oropharynx is clear. No oropharyngeal exudate.  Eyes:     General: No scleral icterus.       Right eye: No discharge.        Left eye: No discharge.     Conjunctiva/sclera: Conjunctivae normal.     Pupils: Pupils are equal, round, and reactive to light.  Neck:     Thyroid: No thyroid mass, thyromegaly or thyroid tenderness.  Cardiovascular:     Rate and Rhythm: Normal rate and regular rhythm.     Pulses: Normal pulses.     Heart sounds: Normal heart sounds.  Pulmonary:     Effort: Pulmonary effort is normal. No respiratory distress.     Breath sounds: Normal breath sounds.  Abdominal:     General: Abdomen is flat. Bowel sounds are normal.     Palpations: Abdomen is soft.  Musculoskeletal:        General: Normal range  of motion.     Cervical back: Normal range of motion.     Right lower leg: No edema.     Left lower leg: No edema.  Lymphadenopathy:     Cervical: No cervical adenopathy.  Skin:    General: Skin is warm and dry.     Findings: Lesion (Right forearm) present.  Neurological:     General: No focal deficit present.     Mental Status: He is alert and oriented to person, place, and time. Mental status is at baseline.     Deep Tendon Reflexes:     Reflex Scores:      Patellar reflexes are 2+ on the right side and 2+ on the left side. Psychiatric:        Mood and Affect: Mood normal.        Behavior: Behavior normal.        Thought Content: Thought content normal.        Judgment: Judgment normal.         At today's visit, we discussed treatment options, associated risk and benefits, and engage in counseling as needed.  Additionally the following were reviewed: Past medical records, past medical and surgical history, family and social background, as well as relevant laboratory results, imaging findings, and specialty notes, where applicable.  This message was generated using dictation software, and as a result, it may contain unintentional typos or errors.  Nevertheless, extensive effort was made to accurately convey at the pertinent aspects of the patient visit.    There may have been are other unrelated non-urgent complaints, but due to the busy schedule and the amount of time already spent with him, time does not permit to address these issues at today's visit. Another appointment may have or has been requested to review these additional issues.     Thomes Dinning, MD, MS

## 2022-11-13 LAB — IRON AND TIBC
Iron Saturation: 34 % (ref 15–55)
Iron: 98 ug/dL (ref 38–169)
Total Iron Binding Capacity: 286 ug/dL (ref 250–450)
UIBC: 188 ug/dL (ref 111–343)

## 2022-11-13 LAB — HCV INTERPRETATION

## 2022-11-13 LAB — HCV AB W REFLEX TO QUANT PCR: HCV Ab: NONREACTIVE

## 2022-11-14 DIAGNOSIS — L989 Disorder of the skin and subcutaneous tissue, unspecified: Secondary | ICD-10-CM | POA: Insufficient documentation

## 2022-11-14 NOTE — Assessment & Plan Note (Signed)
Controlled on Famotidine 20 mg once daily. Continue monitoring, lifestyle modifications advised Follow-up PRN.

## 2022-11-14 NOTE — Assessment & Plan Note (Signed)
Diagnosed and managed with integrated behavioral health. Chronic Condition Status: Managed. Plan: Continue current management plan. Patient to follow up with the therapist on October 30th for medication review.

## 2022-11-14 NOTE — Assessment & Plan Note (Signed)
Darkened spot, likely benign. Chronic Condition Status: Stable.  Plan: Document and monitor for any changes. Consider dermatology consult if significant changes are noted.

## 2022-11-14 NOTE — Assessment & Plan Note (Signed)
Requires periodic monitoring. Plan: Order lab work to check current levels.

## 2022-11-15 ENCOUNTER — Encounter: Payer: Self-pay | Admitting: Family Medicine

## 2022-11-15 LAB — HIV ANTIBODY (ROUTINE TESTING W REFLEX): HIV 1&2 Ab, 4th Generation: NONREACTIVE

## 2022-11-15 LAB — VITAMIN D 1,25 DIHYDROXY
Vitamin D 1, 25 (OH)2 Total: 72 pg/mL (ref 18–72)
Vitamin D2 1, 25 (OH)2: <8 pg/mL
Vitamin D3 1, 25 (OH)2: 72 pg/mL

## 2022-11-17 ENCOUNTER — Ambulatory Visit: Payer: Commercial Managed Care - PPO | Admitting: Family Medicine

## 2023-04-12 ENCOUNTER — Encounter (INDEPENDENT_AMBULATORY_CARE_PROVIDER_SITE_OTHER): Payer: Self-pay | Admitting: Family Medicine

## 2023-04-12 DIAGNOSIS — J4520 Mild intermittent asthma, uncomplicated: Secondary | ICD-10-CM

## 2023-04-13 DIAGNOSIS — J4520 Mild intermittent asthma, uncomplicated: Secondary | ICD-10-CM

## 2023-04-13 MED ORDER — ALBUTEROL SULFATE HFA 108 (90 BASE) MCG/ACT IN AERS
2.0000 | INHALATION_SPRAY | Freq: Four times a day (QID) | RESPIRATORY_TRACT | 0 refills | Status: DC | PRN
Start: 2023-04-13 — End: 2023-05-08

## 2023-04-13 NOTE — Telephone Encounter (Signed)
  Thank you for your message seeking medical advice.* My assessment and recommendation are as follows:  Refill albuterol inhaler for asthma. Follow up if not improving.   Sincerely,  Garnette Gunner, MD    *This exchange required the expertise of a doctor, nurse practitioner, physician assistant, optometrist or certified nurse midwife and qualifies as a Medical Advice Message, please visit [email or web address] for more details. [facility] will bill your insurance on your behalf; copays and deductibles may apply. Questions? Reply to this message.

## 2023-04-13 NOTE — Telephone Encounter (Signed)
 Please see the MyChart message reply(ies) for my assessment and plan.    This patient gave consent for this Medical Advice Message and is aware that it may result in a bill to [employer], as well as the possibility of receiving a bill for a co-payment or deductible. They are an established patient, but are not seeking medical advice exclusively about a problem treated during an in person or video visit in the last seven days. I did not recommend an in person or video visit within seven days of my reply.    I spent a total of 10 minutes cumulative time within 7 days through [employer].  Garnette Gunner, MD

## 2023-05-08 ENCOUNTER — Other Ambulatory Visit: Payer: Self-pay | Admitting: Family Medicine

## 2023-05-08 DIAGNOSIS — J4520 Mild intermittent asthma, uncomplicated: Secondary | ICD-10-CM
